# Patient Record
Sex: Female | Born: 1950
Health system: Southern US, Community
[De-identification: ages and names within clinical notes are randomized; demographics above are authoritative.]

## PROBLEM LIST (undated history)

## (undated) DIAGNOSIS — J449 Chronic obstructive pulmonary disease, unspecified: Secondary | ICD-10-CM

## (undated) DIAGNOSIS — M810 Age-related osteoporosis without current pathological fracture: Secondary | ICD-10-CM

## (undated) DIAGNOSIS — N200 Calculus of kidney: Secondary | ICD-10-CM

## (undated) HISTORY — PX: OTHER SURGICAL HISTORY: SHX169

---

## 2000-01-30 ENCOUNTER — Emergency Department (HOSPITAL_COMMUNITY): Admission: EM | Admit: 2000-01-30 | Discharge: 2000-01-30 | Payer: Self-pay | Admitting: Emergency Medicine

## 2000-01-30 ENCOUNTER — Encounter: Payer: Self-pay | Admitting: Emergency Medicine

## 2001-08-04 ENCOUNTER — Encounter: Payer: Self-pay | Admitting: Family Medicine

## 2001-08-04 ENCOUNTER — Ambulatory Visit (HOSPITAL_COMMUNITY): Admission: RE | Admit: 2001-08-04 | Discharge: 2001-08-04 | Payer: Self-pay | Admitting: Family Medicine

## 2004-08-22 ENCOUNTER — Ambulatory Visit (HOSPITAL_COMMUNITY): Admission: RE | Admit: 2004-08-22 | Discharge: 2004-08-22 | Payer: Self-pay | Admitting: Family Medicine

## 2006-11-09 ENCOUNTER — Ambulatory Visit: Payer: Self-pay | Admitting: Orthopedic Surgery

## 2006-11-17 ENCOUNTER — Encounter (HOSPITAL_COMMUNITY): Admission: RE | Admit: 2006-11-17 | Discharge: 2006-12-17 | Payer: Self-pay | Admitting: Orthopedic Surgery

## 2006-12-28 ENCOUNTER — Ambulatory Visit: Payer: Self-pay | Admitting: Orthopedic Surgery

## 2007-03-01 ENCOUNTER — Ambulatory Visit: Payer: Self-pay | Admitting: Orthopedic Surgery

## 2010-02-19 ENCOUNTER — Emergency Department (HOSPITAL_COMMUNITY): Admission: EM | Admit: 2010-02-19 | Discharge: 2010-02-19 | Payer: Self-pay | Admitting: Emergency Medicine

## 2012-01-19 ENCOUNTER — Encounter (HOSPITAL_COMMUNITY): Payer: Self-pay | Admitting: *Deleted

## 2012-01-19 ENCOUNTER — Emergency Department (HOSPITAL_COMMUNITY): Payer: Self-pay

## 2012-01-19 ENCOUNTER — Emergency Department (HOSPITAL_COMMUNITY)
Admission: EM | Admit: 2012-01-19 | Discharge: 2012-01-20 | Disposition: A | Discharge status: Home / Self Care | Payer: Self-pay | Attending: Emergency Medicine | Admitting: Emergency Medicine

## 2012-01-19 DIAGNOSIS — R197 Diarrhea, unspecified: Secondary | ICD-10-CM | POA: Insufficient documentation

## 2012-01-19 DIAGNOSIS — F17200 Nicotine dependence, unspecified, uncomplicated: Secondary | ICD-10-CM | POA: Insufficient documentation

## 2012-01-19 DIAGNOSIS — R5383 Other fatigue: Secondary | ICD-10-CM | POA: Insufficient documentation

## 2012-01-19 DIAGNOSIS — R062 Wheezing: Secondary | ICD-10-CM | POA: Insufficient documentation

## 2012-01-19 DIAGNOSIS — R5381 Other malaise: Secondary | ICD-10-CM | POA: Insufficient documentation

## 2012-01-19 DIAGNOSIS — K5900 Constipation, unspecified: Secondary | ICD-10-CM | POA: Insufficient documentation

## 2012-01-19 DIAGNOSIS — R1011 Right upper quadrant pain: Secondary | ICD-10-CM | POA: Insufficient documentation

## 2012-01-19 DIAGNOSIS — R109 Unspecified abdominal pain: Secondary | ICD-10-CM | POA: Insufficient documentation

## 2012-01-19 LAB — CBC
HCT: 46.4 % — ABNORMAL HIGH (ref 36.0–46.0)
MCH: 31.8 pg (ref 26.0–34.0)
MCHC: 34.7 g/dL (ref 30.0–36.0)
MCV: 91.5 fL (ref 78.0–100.0)
RDW: 12.6 % (ref 11.5–15.5)
WBC: 8.4 10*3/uL (ref 4.0–10.5)

## 2012-01-19 LAB — DIFFERENTIAL
Basophils Absolute: 0.1 10*3/uL (ref 0.0–0.1)
Eosinophils Absolute: 0.2 10*3/uL (ref 0.0–0.7)
Eosinophils Relative: 2 % (ref 0–5)
Lymphocytes Relative: 22 % (ref 12–46)
Monocytes Absolute: 0.6 10*3/uL (ref 0.1–1.0)

## 2012-01-19 LAB — URINALYSIS, ROUTINE W REFLEX MICROSCOPIC
Hgb urine dipstick: NEGATIVE
Nitrite: NEGATIVE
Specific Gravity, Urine: 1.015 (ref 1.005–1.030)
Urobilinogen, UA: 0.2 mg/dL (ref 0.0–1.0)

## 2012-01-19 LAB — COMPREHENSIVE METABOLIC PANEL
AST: 12 U/L (ref 0–37)
CO2: 29 mEq/L (ref 19–32)
Calcium: 9.7 mg/dL (ref 8.4–10.5)
Creatinine, Ser: 0.9 mg/dL (ref 0.50–1.10)
GFR calc Af Amer: 78 mL/min — ABNORMAL LOW (ref >90)
GFR calc non Af Amer: 67 mL/min — ABNORMAL LOW (ref >90)

## 2012-01-19 MED ORDER — SODIUM CHLORIDE 0.9 % IN NEBU
INHALATION_SOLUTION | RESPIRATORY_TRACT | Status: AC
  Administered 2012-01-19: 3 mL
  Filled 2012-01-19: qty 3

## 2012-01-19 MED ORDER — HYDROCODONE-ACETAMINOPHEN 5-325 MG PO TABS
2.0000 | ORAL_TABLET | Freq: Once | ORAL | Status: AC
  Administered 2012-01-19: 2 via ORAL
  Filled 2012-01-19: qty 2

## 2012-01-19 MED ORDER — ALBUTEROL SULFATE (5 MG/ML) 0.5% IN NEBU
2.5000 mg | INHALATION_SOLUTION | Freq: Once | RESPIRATORY_TRACT | Status: AC
  Administered 2012-01-19: 2.5 mg via RESPIRATORY_TRACT
  Filled 2012-01-19: qty 0.5

## 2012-01-19 NOTE — ED Notes (Signed)
Assumed c/o pt

## 2012-01-19 NOTE — ED Provider Notes (Signed)
History  This chart was scribed for [employer]. Colon Branch, MD by Toya Smothers. The patient was seen in room APA10/APA10. Patient's care was started at 2039.  CSN: [id number]  Arrival date & time 01/19/12  2039   First MD Initiated Contact with Patient 01/19/12 2051     Chief Complaint  Patient presents with  . Abdominal Pain    The history is provided by the patient. No language interpreter was used.    Brittany Bass is a 61 y.o. female with a h/o jaundice who presents to the Emergency Department complaining of gradual onset, gradually worsening, constant, severe right flank pain described as stabbing that radiates to the back onset 2 days ago with associated symptoms of diarrhea and lethargy. Pt denies injury or strenuous activity as the cause of the pain. She denies any modifying factors. She denies taking any medication to the relieve pain. She states that she had similar episodes of symptoms with jaundice that she developed during childbirth 21 years ago.  Pt denies fever, cough, chills, dysuria, and polyuria. The pt is a current everyday smoker but denies the use of alcohol.  History reviewed. No pertinent past medical history.  Past Surgical History  Procedure Date  . Cesarean section   . Jaundice    History reviewed. No pertinent family history.  History  Substance Use Topics  . Smoking status: Current Everyday Smoker  . Smokeless tobacco: Not on file  . Alcohol Use: No    Review of Systems  A complete 10 system review of systems was obtained and all systems are negative except as noted in the HPI and PMH.   Allergies  Shellfish allergy and Penicillins  Home Medications  No current outpatient prescriptions on file.  Triage Vitals: BP 169/76  Pulse 80  Temp(Src) 97.4 F (36.3 C) (Oral)  Resp 16  Ht 5\' 2"  (1.575 m)  Wt 194 lb 4.8 oz (88.134 kg)  BMI 35.54 kg/m2  SpO2 100%  Physical Exam  Nursing note and vitals reviewed. Constitutional: She is oriented to person,  place, and time. She appears well-developed and well-nourished.  HENT:  Head: Normocephalic and atraumatic.  Eyes: Conjunctivae and EOM are normal.  Neck: Normal range of motion. Neck supple.  Cardiovascular: Normal rate, regular rhythm, normal heart sounds and intact distal pulses.  Exam reveals no gallop and no friction rub.   No murmur heard. Pulmonary/Chest: Effort normal. No respiratory distress. She has wheezes (Expiratory wheezing). She has no rales. She exhibits no tenderness.  Abdominal: Soft. Bowel sounds are normal. She exhibits no distension. There is no tenderness. There is no rebound and no guarding.  Musculoskeletal: Normal range of motion. She exhibits tenderness. She exhibits no edema.       Focal tenderness at the costal margin mid axillary line; pain is reproducible upon palpation  Neurological: She is alert and oriented to person, place, and time. No cranial nerve deficit.  Skin: Skin is warm and dry.  Psychiatric: She has a normal mood and affect. Her behavior is normal.   ED Course  Procedures (including critical care time)  DIAGNOSTIC STUDIES: Oxygen Saturation is 100% on room air, normal by my interpretation.    COORDINATION OF CARE: 9:50PM-Discussed treatment plan which includes urinalysis, breathing treatment, x-ray of abdomen and pain medications with pt and pt agreed to plan.  Results for orders placed during the hospital encounter of 01/19/12  CBC      Component Value Range   WBC 8.4  4.0 -  10.5 (K/uL)   RBC 5.07  3.87 - 5.11 (MIL/uL)   Hemoglobin 16.1 (*) 12.0 - 15.0 (g/dL)   HCT 28.4 (*) 13.2 - 46.0 (%)   MCV 91.5  78.0 - 100.0 (fL)   MCH 31.8  26.0 - 34.0 (pg)   MCHC 34.7  30.0 - 36.0 (g/dL)   RDW 44.0  10.2 - 72.5 (%)   Platelets 186  150 - 400 (K/uL)  DIFFERENTIAL      Component Value Range   Neutrophils Relative 68  43 - 77 (%)   Neutro Abs 5.8  1.7 - 7.7 (K/uL)   Lymphocytes Relative 22  12 - 46 (%)   Lymphs Abs 1.8  0.7 - 4.0 (K/uL)    Monocytes Relative 7  3 - 12 (%)   Monocytes Absolute 0.6  0.1 - 1.0 (K/uL)   Eosinophils Relative 2  0 - 5 (%)   Eosinophils Absolute 0.2  0.0 - 0.7 (K/uL)   Basophils Relative 1  0 - 1 (%)   Basophils Absolute 0.1  0.0 - 0.1 (K/uL)  COMPREHENSIVE METABOLIC PANEL      Component Value Range   Sodium 137  135 - 145 (mEq/L)   Potassium 3.8  3.5 - 5.1 (mEq/L)   Chloride 101  96 - 112 (mEq/L)   CO2 29  19 - 32 (mEq/L)   Glucose, Bld 107 (*) 70 - 99 (mg/dL)   BUN 11  6 - 23 (mg/dL)   Creatinine, Ser 3.66  0.50 - 1.10 (mg/dL)   Calcium 9.7  8.4 - 44.0 (mg/dL)   Total Protein 6.9  6.0 - 8.3 (g/dL)   Albumin 3.7  3.5 - 5.2 (g/dL)   AST 12  0 - 37 (U/L)   ALT 13  0 - 35 (U/L)   Alkaline Phosphatase 90  39 - 117 (U/L)   Total Bilirubin 0.3  0.3 - 1.2 (mg/dL)   GFR calc non Af Amer 67 (*) >90 (mL/min)   GFR calc Af Amer 78 (*) >90 (mL/min)  URINALYSIS, ROUTINE W REFLEX MICROSCOPIC      Component Value Range   Color, Urine YELLOW  YELLOW    APPearance CLEAR  CLEAR    Specific Gravity, Urine 1.015  1.005 - 1.030    pH 5.5  5.0 - 8.0    Glucose, UA NEGATIVE  NEGATIVE (mg/dL)   Hgb urine dipstick NEGATIVE  NEGATIVE    Bilirubin Urine NEGATIVE  NEGATIVE    Ketones, ur NEGATIVE  NEGATIVE (mg/dL)   Protein, ur NEGATIVE  NEGATIVE (mg/dL)   Urobilinogen, UA 0.2  0.0 - 1.0 (mg/dL)   Nitrite NEGATIVE  NEGATIVE    Leukocytes, UA NEGATIVE  NEGATIVE    Dg Abd Acute W/chest  01/19/2012  *RADIOLOGY REPORT*  Clinical Data: Right upper quadrant abdominal pain  ACUTE ABDOMEN SERIES (ABDOMEN 2 VIEW & CHEST 1 VIEW)  Comparison: None.  Findings:  Normal cardiac silhouette and mediastinal contours with minimal atherosclerotic calcifications within the aortic arch.  Minimal bibasilar opacity favored to represent atelectasis.  No focal airspace opacities.  No pleural effusion or pneumothorax.  There is mild gaseous distension of the colon without definite evidence of enteric obstruction.  No pneumoperitoneum,  pneumatosis or portal venous gas.  Mild S-shaped scoliotic curvature of the thoracolumbar spine, possibly positional.  No acute osseous abnormalities.  IMPRESSION: 1.  No acute cardiopulmonary disease. 2.  Mild gaseous distension of the colon, nonspecific but may suggest ileus.  Original Report Authenticated By: Jonny Ruiz  A. Judithann Sheen, M.D.      MDM  Patient with RUQ abdominal pain and mild constipation. Laboratory data was unremarkable. Acute abdomen series without acute findings. Incidental during the physical exam the patient has some expiratory wheezing. She has received hydrocodone, albuterol, and IV fluids. Pain had improved.Pt stable in ED with no significant deterioration in condition.The patient appears reasonably screened and/or stabilized for discharge and I doubt any other medical condition or other [facility] requiring further screening, evaluation, or treatment in the ED at this time prior to discharge.  I personally performed the services described in this documentation, which was scribed in my presence. The recorded information has been reviewed and considered.   MDM Reviewed: nursing note and vitals Interpretation: labs and x-ray       Nicoletta Dress. Colon Branch, MD 01/21/12 1018

## 2012-01-19 NOTE — ED Notes (Signed)
RUQ pain, and around to back.

## 2012-01-20 MED ORDER — HYDROCODONE-ACETAMINOPHEN 5-325 MG PO TABS: 1.0000 | ORAL_TABLET | ORAL | Status: AC | PRN

## 2012-01-20 NOTE — ED Notes (Signed)
Left in c/o family for transport home; in no distress; instructions/prescriptions reviewed-verbalizes understanding.

## 2012-01-20 NOTE — Discharge Instructions (Signed)
Her blood work was normal here tonight. That film of your abdomen shows only gas. There is no evidence of an obstruction or dangerous condition. Use mild laxative like MiraLax. He may use the pain medicine as needed however be careful because pain medicine can also constipate you.Follow up with your doctor.

## 2012-01-25 ENCOUNTER — Encounter (HOSPITAL_COMMUNITY): Payer: Self-pay | Admitting: *Deleted

## 2012-01-25 ENCOUNTER — Emergency Department (HOSPITAL_COMMUNITY)
Admission: EM | Admit: 2012-01-25 | Discharge: 2012-01-25 | Disposition: A | Discharge status: Home / Self Care | Payer: Self-pay | Attending: Emergency Medicine | Admitting: Emergency Medicine

## 2012-01-25 DIAGNOSIS — B029 Zoster without complications: Secondary | ICD-10-CM | POA: Insufficient documentation

## 2012-01-25 DIAGNOSIS — F17200 Nicotine dependence, unspecified, uncomplicated: Secondary | ICD-10-CM | POA: Insufficient documentation

## 2012-01-25 MED ORDER — TRAMADOL HCL 50 MG PO TABS: 100.0000 mg | ORAL_TABLET | Freq: Four times a day (QID) | ORAL | Status: AC | PRN

## 2012-01-25 MED ORDER — PREDNISONE 20 MG PO TABS: 60.0000 mg | ORAL_TABLET | Freq: Every day | ORAL | Status: AC

## 2012-01-25 MED ORDER — ACYCLOVIR 400 MG PO TABS: 800.0000 mg | ORAL_TABLET | Freq: Every day | ORAL | Status: AC

## 2012-01-25 NOTE — ED Provider Notes (Signed)
History     CSN: [id number]  Arrival date & time 01/25/12  0547   First MD Initiated Contact with Patient 01/25/12 0710      Chief Complaint  Patient presents with  . Herpes Zoster    (Consider location/radiation/quality/duration/timing/severity/associated sxs/prior treatment) HPI....rash on right side of her chest for several days.  Palpation makes it worse. Nothing makes it better. No fever or chills. Pain is sharp. No radiation. Patient thinks is shingles History reviewed. No pertinent past medical history.  Past Surgical History  Procedure Date  . Cesarean section   . Jaundice     History reviewed. No pertinent family history.  History  Substance Use Topics  . Smoking status: Current Everyday Smoker  . Smokeless tobacco: Not on file  . Alcohol Use: No    OB History    Grav Para Term Preterm Abortions TAB SAB Ect Mult Living                  Review of Systems  All other systems reviewed and are negative.    Allergies  Shellfish allergy and Penicillins  Home Medications   Current Outpatient Rx  Name Route Sig Dispense Refill  . ACYCLOVIR 400 MG PO TABS Oral Take 2 tablets (800 mg total) by mouth 5 (five) times daily. 50 tablet 0  . HYDROCODONE-ACETAMINOPHEN 5-325 MG PO TABS Oral Take 1 tablet by mouth every 4 (four) hours as needed for pain. 15 tablet 0  . PREDNISONE 20 MG PO TABS Oral Take 3 tablets (60 mg total) by mouth daily. 21 tablet 0  . TRAMADOL HCL 50 MG PO TABS Oral Take 2 tablets (100 mg total) by mouth every 6 (six) hours as needed for pain. 30 tablet 0    BP 121/60  Pulse 87  Temp(Src) 97.8 F (36.6 C) (Oral)  Resp 16  Ht 5\' 2"  (1.575 m)  Wt 197 lb (89.359 kg)  BMI 36.03 kg/m2  SpO2 99%  Physical Exam  Nursing note and vitals reviewed. Constitutional: She is oriented to person, place, and time. She appears well-developed and well-nourished.  HENT:  Head: Normocephalic.  Musculoskeletal: Normal range of motion.  Neurological: She  is alert and oriented to person, place, and time.  Skin:       Area of erythema right lateral chest wall approximately T10 dermatome. Crusted healing vesicles.  Psychiatric: She has a normal mood and affect.    ED Course  Procedures (including critical care time)  Labs Reviewed - No data to display No results found.   1. Shingles       MDM  History and physical consistent with shingles. Will Rx tramadol, prednisone, acyclovir. Discussed with patient and friend        Donnetta Hutching, MD 01/25/12 [phone]

## 2012-01-25 NOTE — Discharge Instructions (Signed)
Shingles Shingles is caused by the same virus that causes chickenpox (varicella zoster virus or VZV). Shingles often occurs many years or decades after having chickenpox. That is why it is more common in adults older than 50 years. The virus reactivates and breaks out as an infection in a nerve root. SYMPTOMS   The initial feeling (sensations) may be pain. This pain is usually described as:   Burning.   Stabbing.   Throbbing.   Tingling in the nerve root.   A red rash will follow in a couple days. The rash may occur in any area of the body and is usually on one side (unilateral) of the body in a band or belt-like pattern. The rash usually starts out as very small blisters (vesicles). They will dry up after 7 to 10 days. This is not usually a significant problem except for the pain it causes.   Long-lasting (chronic) pain is more likely in an elderly person. It can last months to years. This condition is called postherpetic neuralgia.  Shingles can be an extremely severe infection in someone with AIDS, a weakened immune system, or with forms of leukemia. It can also be severe if you are taking transplant medicines or other medicines that weaken the immune system. TREATMENT  Your caregiver will often treat you with:  Antiviral drugs.   Anti-inflammatory drugs.   Pain medicines.  Bed rest is very important in preventing the pain associated with herpes zoster (postherpetic neuralgia). Application of heat in the form of a hot water bottle or electric heating pad or gentle pressure with the hand is recommended to help with the pain or discomfort. PREVENTION  A varicella zoster vaccine is available to help protect against the virus. The Food and Drug Administration approved the varicella zoster vaccine for individuals 48 years of age and older. HOME CARE INSTRUCTIONS   Cool compresses to the area of rash may be helpful.   Only take over-the-counter or prescription medicines for pain,  discomfort, or fever as directed by your caregiver.   Avoid contact with:   Babies.   Pregnant women.   Children with eczema.   Elderly people with transplants.   People with chronic illnesses, such as leukemia and AIDS.   If the area involved is on your face, you may receive a referral for follow-up to a specialist. It is very important to keep all follow-up appointments. This will help avoid eye complications, chronic pain, or disability.  SEEK IMMEDIATE MEDICAL CARE IF:   You develop any pain (headache) in the area of the face or eye. This must be followed carefully by your caregiver or ophthalmologist. An infection in part of your eye (cornea) can be very serious. It could lead to blindness.   You do not have pain relief from prescribed medicines.   Your redness or swelling spreads.   The area involved becomes very swollen and painful.   You have a fever.   You notice any red or painful lines extending away from the affected area toward your heart (lymphangitis).   Your condition is worsening or has changed.  Document Released: 09/08/2005 Document Revised: 08/28/2011 Document Reviewed: 08/13/2009 ExitCare Patient Information 2012 [place]  Medication for pain, inflammation, antiviral.  Off work Sunday secondary to illness

## 2012-01-25 NOTE — ED Notes (Signed)
Broke out in rash on right side for  2 days. Thinks it shingles

## 2015-11-28 ENCOUNTER — Emergency Department (HOSPITAL_COMMUNITY)
Admission: EM | Admit: 2015-11-28 | Discharge: 2015-11-28 | Disposition: A | Discharge status: Home / Self Care | Payer: PPO | Attending: Emergency Medicine | Admitting: Emergency Medicine

## 2015-11-28 ENCOUNTER — Encounter (HOSPITAL_COMMUNITY): Payer: Self-pay | Admitting: Emergency Medicine

## 2015-11-28 DIAGNOSIS — X58XXXA Exposure to other specified factors, initial encounter: Secondary | ICD-10-CM | POA: Diagnosis not present

## 2015-11-28 DIAGNOSIS — IMO0001 Reserved for inherently not codable concepts without codable children: Secondary | ICD-10-CM

## 2015-11-28 DIAGNOSIS — Y939 Activity, unspecified: Secondary | ICD-10-CM | POA: Diagnosis not present

## 2015-11-28 DIAGNOSIS — L03012 Cellulitis of left finger: Secondary | ICD-10-CM | POA: Insufficient documentation

## 2015-11-28 DIAGNOSIS — F17210 Nicotine dependence, cigarettes, uncomplicated: Secondary | ICD-10-CM | POA: Diagnosis not present

## 2015-11-28 DIAGNOSIS — S6992XA Unspecified injury of left wrist, hand and finger(s), initial encounter: Secondary | ICD-10-CM | POA: Diagnosis not present

## 2015-11-28 DIAGNOSIS — Y929 Unspecified place or not applicable: Secondary | ICD-10-CM | POA: Insufficient documentation

## 2015-11-28 DIAGNOSIS — Y999 Unspecified external cause status: Secondary | ICD-10-CM | POA: Insufficient documentation

## 2015-11-28 MED ORDER — LIDOCAINE HCL (PF) 1 % IJ SOLN
INTRAMUSCULAR | Status: DC
Start: 2015-11-28 — End: 2015-11-28
  Filled 2015-11-28: qty 5

## 2015-11-28 MED ORDER — SULFAMETHOXAZOLE-TRIMETHOPRIM 800-160 MG PO TABS: 1.0000 | ORAL_TABLET | Freq: Two times a day (BID) | ORAL | Status: AC

## 2015-11-28 NOTE — Discharge Instructions (Signed)
Paronychia °Paronychia is an infection of the skin that surrounds a nail. It usually affects the skin around a fingernail, but it may also occur near a toenail. It often causes pain and swelling around the nail. This condition may come on suddenly or develop over a longer period. In some cases, a collection of pus (abscess) can form near or under the nail. Usually, paronychia is not serious and it clears up with treatment. °CAUSES °This condition may be caused by bacteria or fungi. It is commonly caused by either Streptococcus or Staphylococcus bacteria. The bacteria or fungi often cause the infection by getting into the affected area through an opening in the skin, such as a cut or a hangnail. °RISK FACTORS °This condition is more likely to develop in: °· People who get their hands wet often, such as those who work as dishwashers, bartenders, or nurses. °· People who bite their fingernails or suck their thumbs. °· People who trim their nails too short. °· People who have hangnails or injured fingertips. °· People who get manicures. °· People who have diabetes. °SYMPTOMS °Symptoms of this condition include: °· Redness and swelling of the skin near the nail. °· Tenderness around the nail when you touch the area. °· Pus-filled bumps under the cuticle. The cuticle is the skin at the base or sides of the nail. °· Fluid or pus under the nail. °· Throbbing pain in the area. °DIAGNOSIS °This condition is usually diagnosed with a physical exam. In some cases, a sample of pus may be taken from an abscess to be tested in a lab. This can help to determine what type of bacteria or fungi is causing the condition. °TREATMENT °Treatment for this condition depends on the cause and severity of the condition. If the condition is mild, it may clear up on its own in a few days. Your health care provider may recommend soaking the affected area in warm water a few times a day. When treatment is needed, the options may  include: °· Antibiotic medicine, if the condition is caused by a bacterial infection. °· Antifungal medicine, if the condition is caused by a fungal infection. °· Incision and drainage, if an abscess is present. In this procedure, the health care provider will cut open the abscess so the pus can drain out. °HOME CARE INSTRUCTIONS °· Soak the affected area in warm water if directed to do so by your health care provider. You may be told to do this for 20 minutes, 2-3 times a day. Keep the area dry in between soakings. °· Take medicines only as directed by your health care provider. °· If you were prescribed an antibiotic medicine, finish all of it even if you start to feel better. °· Keep the affected area clean. °· Do not try to drain a fluid-filled bump yourself. °· If you will be washing dishes or performing other tasks that require your hands to get wet, wear rubber gloves. You should also wear gloves if your hands might come in contact with irritating substances, such as cleaners or chemicals. °· Follow your health care provider's instructions about: °¨ Wound care. °¨ Bandage (dressing) changes and removal. °SEEK MEDICAL CARE IF: °· Your symptoms get worse or do not improve with treatment. °· You have a fever or chills. °· You have redness spreading from the affected area. °· You have continued or increased fluid, blood, or pus coming from the affected area. °· Your finger or knuckle becomes swollen or is difficult to move. °  °  This information is not intended to replace advice given to you by your health care provider. Make sure you discuss any questions you have with your health care provider. °  °Document Released: 03/04/2001 Document Revised: 01/23/2015 Document Reviewed: 08/16/2014 °Elsevier Interactive Patient Education ©2016 [employer] ° °

## 2015-11-28 NOTE — ED Notes (Signed)
Pt states she pulled a hangnail about 2 weeks ago and it has been infected.

## 2015-11-30 NOTE — ED Provider Notes (Signed)
CSN: [id number]     Arrival date & time 11/28/15  1011 History   First MD Initiated Contact with Patient 11/28/15 1135     Chief Complaint  Patient presents with  . Finger Injury     (Consider location/radiation/quality/duration/timing/severity/associated sxs/prior Treatment) The history is provided by the patient.   Brittany Bass is a 65 y.o. female with no significant past medical history presenting with pain, redness and persistent swelling of her left ring finger tip since pulling a hangnail about 2 weeks ago.  She endorses using warm epsom salt soaks and has been able to express a small amount of clear to yellow drainage from the cuticle edge, but continues to have pain, redness and swelling. There is no radiation of pain which is tender to palpation but tolerable at rest. She has taken no medicines for this problem.      History reviewed. No pertinent past medical history. Past Surgical History  Procedure Laterality Date  . Cesarean section    . Jaundice     History reviewed. No pertinent family history. Social History  Substance Use Topics  . Smoking status: Current Every Day Smoker -- 1.00 packs/day    Types: Cigarettes  . Smokeless tobacco: None  . Alcohol Use: No   OB History    No data available     Review of Systems  Constitutional: Negative for fever and chills.  Musculoskeletal: Positive for arthralgias. Negative for myalgias.  Skin: Positive for color change.  Neurological: Negative for weakness and numbness.      Allergies  Shellfish allergy and Penicillins  Home Medications   Prior to Admission medications   Medication Sig Start Date End Date Taking? Authorizing Provider  sulfamethoxazole-trimethoprim (BACTRIM DS,SEPTRA DS) 800-160 MG tablet Take 1 tablet by mouth 2 (two) times daily. 11/28/15 12/05/15  Evalee Jefferson, PA-C   BP 120/47 mmHg  Pulse 66  Temp(Src) 98 F (36.7 C) (Oral)  Resp 14  Ht 5\' 2"  (1.575 m)  Wt 108.863 kg  BMI 43.89 kg/m2   SpO2 98% Physical Exam  Constitutional: She appears well-developed and well-nourished. No distress.  HENT:  Head: Normocephalic.  Neck: Neck supple.  Cardiovascular: Normal rate.   Pulmonary/Chest: Effort normal.  Musculoskeletal: Normal range of motion. She exhibits tenderness. She exhibits no edema.  Skin: There is erythema.  Erythema and edema of left 4th dorsal phalanx surrounding the nail plate.  No fluctuance or visible yellow pocket suggesting additional pus pocket. Volar fingertip is soft. No red streaking.     ED Course  Procedures (including critical care time) Labs Review Labs Reviewed - No data to display  Imaging Review No results found. I have personally reviewed and evaluated these images and lab results as part of my medical decision-making.   EKG Interpretation None      MDM   Final diagnoses:  Paronychia of fourth finger of left hand    Offered I and D, although I suspect the paronychia for the most part has already drained, pt deferred.  Desirous of abx.  She was placed on bactrim, advised to continue warm soaks as she is doing. Advised close f/u for any worsening sx.  Pt understands and agrees with plan.    Evalee Jefferson, PA-C 11/30/15 [place], DO 12/03/15 1635

## 2016-12-22 ENCOUNTER — Emergency Department (HOSPITAL_COMMUNITY)
Admission: EM | Admit: 2016-12-22 | Discharge: 2016-12-22 | Disposition: A | Discharge status: Home / Self Care | Payer: PPO | Attending: Emergency Medicine | Admitting: Emergency Medicine

## 2016-12-22 ENCOUNTER — Encounter (HOSPITAL_COMMUNITY): Payer: Self-pay | Admitting: Emergency Medicine

## 2016-12-22 ENCOUNTER — Emergency Department (HOSPITAL_COMMUNITY): Payer: PPO

## 2016-12-22 DIAGNOSIS — R05 Cough: Secondary | ICD-10-CM | POA: Diagnosis not present

## 2016-12-22 DIAGNOSIS — R0602 Shortness of breath: Secondary | ICD-10-CM | POA: Diagnosis not present

## 2016-12-22 DIAGNOSIS — J019 Acute sinusitis, unspecified: Secondary | ICD-10-CM | POA: Insufficient documentation

## 2016-12-22 DIAGNOSIS — Z720 Tobacco use: Secondary | ICD-10-CM

## 2016-12-22 DIAGNOSIS — J189 Pneumonia, unspecified organism: Secondary | ICD-10-CM | POA: Diagnosis not present

## 2016-12-22 DIAGNOSIS — F17210 Nicotine dependence, cigarettes, uncomplicated: Secondary | ICD-10-CM | POA: Insufficient documentation

## 2016-12-22 MED ORDER — CLARITHROMYCIN 500 MG PO TABS: 500.0000 mg | ORAL_TABLET | Freq: Two times a day (BID) | ORAL | 0 refills | Status: DC

## 2016-12-22 NOTE — Discharge Instructions (Signed)
Use a nasal decongestant like Afrin for 3-4 days. If you need additional nasal decongestant, use Flonase nasal spray.  For cough, use Robitussin DM.  Use Tylenol for fever or pain.  Follow up with a primary care doctor for a check up in 3-4 weeks. You need to have a repeat Chest X-ray then.   Return here if needed for problems.

## 2016-12-22 NOTE — ED Provider Notes (Signed)
[facility] DEPT Provider Note   CSN: [id number] Arrival date & time: 12/22/16  1016     History   Chief Complaint Chief Complaint  Patient presents with  . Cough    HPI Brittany Bass is a 66 y.o. female.  She presents for evaluation of nasal congestion, followed by cough, for 1 week.  Nasal congestion is green in color as is the sputum with coughing.  He has mild dyspnea on exertion.  She is a smoker and states that she has COPD, but does not take anything for it.  He denies nausea or vomiting but has not had much appetite in the last 2 days.  She denies chest pain, weakness, paresthesia, back pain.  No recent similar problems.  No chronic sinus problems.  There are no other known modifying factors.    HPI  History reviewed. No pertinent past medical history.  There are no active problems to display for this patient.   Past Surgical History:  Procedure Laterality Date  . CESAREAN SECTION    . jaundice      OB History    No data available       Home Medications    Prior to Admission medications   Medication Sig Start Date End Date Taking? Authorizing Provider  clarithromycin (BIAXIN) 500 MG tablet Take 1 tablet (500 mg total) by mouth 2 (two) times daily. 12/22/16   Daleen Bo, MD    Family History History reviewed. No pertinent family history.  Social History Social History  Substance Use Topics  . Smoking status: Current Every Day Smoker    Packs/day: 1.00    Types: Cigarettes  . Smokeless tobacco: Never Used  . Alcohol use No     Allergies   Shellfish allergy and Penicillins   Review of Systems Review of Systems  All other systems reviewed and are negative.    Physical Exam Updated Vital Signs BP 130/70   Pulse 87   Temp 97.8 F (36.6 C) (Oral)   Resp 18   Ht 5\' 2"  (1.575 m)   Wt 210 lb (95.3 kg)   SpO2 97%   BMI 38.41 kg/m   Physical Exam  Constitutional: She is oriented to person, place, and time. She appears  well-developed and well-nourished. No distress.  HENT:  Head: Normocephalic and atraumatic.  Eyes: Conjunctivae and EOM are normal. Pupils are equal, round, and reactive to light.  Neck: Normal range of motion and phonation normal. Neck supple.  Cardiovascular: Normal rate and regular rhythm.   Pulmonary/Chest: Effort normal. She exhibits no tenderness.  Scattered rhonchi, no wheezes.  Good air movement bilaterally.  Abdominal: Soft. She exhibits no distension. There is no tenderness. There is no guarding.  Musculoskeletal: Normal range of motion. She exhibits edema (1+ bilateral lower legs.).  Neurological: She is alert and oriented to person, place, and time. She exhibits normal muscle tone.  Skin: Skin is warm and dry.  Psychiatric: She has a normal mood and affect. Her behavior is normal. Judgment and thought content normal.  Nursing note and vitals reviewed.    ED Treatments / Results  Labs (all labs ordered are listed, but only abnormal results are displayed) Labs Reviewed - No data to display  EKG  EKG Interpretation None       Radiology Dg Chest 2 View  Result Date: 12/22/2016 CLINICAL DATA:  Two days of fever. One week of shortness of breath and cough. Current smoker. EXAM: CHEST  2 VIEW COMPARISON:  None  in [facility] FINDINGS: The lungs are well-expanded. There are coarse lung markings in the right infrahilar region posteriorly. There is no pleural effusion or pneumothorax. The heart and pulmonary vascularity are normal. There is calcification in the wall of the aortic arch. The trachea is midline. The bony thorax is unremarkable. IMPRESSION: Patchy alveolar opacities in the right lower lobe consistent with pneumonia. Followup PA and lateral chest X-ray is recommended in 3-4 weeks following trial of antibiotic therapy to ensure resolution and exclude underlying malignancy. Thoracic aortic atherosclerosis. Electronically Signed   By: David  Martinique M.D.   On: 12/22/2016 11:37     Procedures Procedures (including critical care time)  Medications Ordered in ED Medications - No data to display   Initial Impression / Assessment and Plan / ED Course  I have reviewed the triage vital signs and the nursing notes.  Pertinent labs & imaging results that were available during my care of the patient were reviewed by me and considered in my medical decision making (see chart for details).     Medications - No data to display  Patient Vitals for the past 24 hrs:  BP Temp Temp src Pulse Resp SpO2 Height Weight  12/22/16 1439 130/70 97.8 F (36.6 C) Oral 87 18 97 % - -  12/22/16 1108 - - - - - - 5\' 2"  (1.575 m) 210 lb (95.3 kg)  12/22/16 1107 118/60 97.8 F (36.6 C) Oral 89 (!) 24 97 % - -    3:29 PM Reevaluation with update and discussion. After initial assessment and treatment, an updated evaluation reveals she remains comfortable has no further complaints.  Findings discussed with patient and son, all questions answered. Toba Claudio L    Final Clinical Impressions(s) / ED Diagnoses   Final diagnoses:  Acute sinusitis, recurrence not specified, unspecified location  Community acquired pneumonia of right lung, unspecified part of lung  Tobacco abuse    Patient presents for evaluation of nasal and chest congestion, with signs and symptoms of infection.  Suspect bacterial component.  COPD, without significant respiratory compromise.  Nursing Notes Reviewed/ Care Coordinated Applicable Imaging Reviewed Interpretation of Laboratory Data incorporated into ED treatment  The patient appears reasonably screened and/or stabilized for discharge and I doubt any other medical condition or other [facility] requiring further screening, evaluation, or treatment in the ED at this time prior to discharge.  Plan: Home Medications-APAP for fever, Robitussin DM for cough, Afrin or Flonase for nasal congestion; Home Treatments-rest, fluids, stop smoking; return here if the  recommended treatment, does not improve the symptoms; Recommended follow up-PCP of choice in 3-4 weeks, and as needed.  Suggest repeat chest x-ray in 1 month.   New Prescriptions New Prescriptions   CLARITHROMYCIN (BIAXIN) 500 MG TABLET    Take 1 tablet (500 mg total) by mouth 2 (two) times daily.     Daleen Bo, MD 12/22/16 [phone]

## 2016-12-22 NOTE — ED Triage Notes (Signed)
Pt c/o congestion and prod cough x 1 week that is green. c/o some sob. c/o fevers. nad at this time

## 2018-12-08 ENCOUNTER — Encounter (HOSPITAL_COMMUNITY): Payer: Self-pay | Admitting: Emergency Medicine

## 2018-12-08 ENCOUNTER — Emergency Department (HOSPITAL_COMMUNITY)
Admission: EM | Admit: 2018-12-08 | Discharge: 2018-12-08 | Disposition: A | Discharge status: Home / Self Care | Payer: PPO | Attending: Emergency Medicine | Admitting: Emergency Medicine

## 2018-12-08 ENCOUNTER — Other Ambulatory Visit: Payer: Self-pay

## 2018-12-08 DIAGNOSIS — F17210 Nicotine dependence, cigarettes, uncomplicated: Secondary | ICD-10-CM | POA: Diagnosis not present

## 2018-12-08 DIAGNOSIS — R21 Rash and other nonspecific skin eruption: Secondary | ICD-10-CM | POA: Diagnosis not present

## 2018-12-08 MED ORDER — PREDNISONE 10 MG PO TABS: 40.0000 mg | ORAL_TABLET | Freq: Every day | ORAL | 0 refills | Status: DC

## 2018-12-08 MED ORDER — PREDNISONE 50 MG PO TABS
60.0000 mg | ORAL_TABLET | Freq: Once | ORAL | Status: AC
  Administered 2018-12-08: 60 mg via ORAL
  Filled 2018-12-08: qty 1

## 2018-12-08 MED ORDER — PREDNISONE 50 MG PO TABS
ORAL_TABLET | ORAL | Status: AC
  Filled 2018-12-08: qty 1

## 2018-12-08 NOTE — ED Provider Notes (Signed)
[facility] EMERGENCY DEPARTMENT Provider Note   CSN: [id number] Arrival date & time: 12/08/18  1906    History   Chief Complaint Chief Complaint  Patient presents with  . Rash    HPI Brittany Bass is a 68 y.o. female.     Patient with onset of rash to right upper extremity and left anterior leg.  States is not on the face or trunk.  Patient took Benadryl at home for it yesterday did not take any today.  Does itch.  Patient is on a fair amount of scratching.  Patient has no tongue swelling lip swelling throat tightening or any wheezing or shortness of breath.  Patient denies any new laundry detergent soaps body wash or environmental changes.  Has not been out in the woods.  Patient is never anything like this before.  Patient also has no upper respiratory symptoms no fever no chills no chest pain no shortness of breath no abdominal pain nausea vomiting or diarrhea.  Patient without any travel outside the state or overseas.  Patient with no known coronavirus contacts.     History reviewed. No pertinent past medical history.  There are no active problems to display for this patient.   Past Surgical History:  Procedure Laterality Date  . CESAREAN SECTION    . jaundice       OB History   No obstetric history on file.      Home Medications    Prior to Admission medications   Medication Sig Start Date End Date Taking? Authorizing Provider  predniSONE (DELTASONE) 10 MG tablet Take 4 tablets (40 mg total) by mouth daily. 12/08/18   Fredia Sorrow, MD    Family History History reviewed. No pertinent family history.  Social History Social History   Tobacco Use  . Smoking status: Current Every Day Smoker    Packs/day: 1.00    Types: Cigarettes  . Smokeless tobacco: Never Used  Substance Use Topics  . Alcohol use: No  . Drug use: No     Allergies   Shellfish allergy and Penicillins   Review of Systems Review of Systems  Constitutional: Negative for chills  and fever.  HENT: Negative for rhinorrhea and sore throat.   Eyes: Negative for visual disturbance.  Respiratory: Negative for cough and shortness of breath.   Cardiovascular: Negative for chest pain and leg swelling.  Gastrointestinal: Negative for abdominal pain, diarrhea, nausea and vomiting.  Genitourinary: Negative for dysuria.  Musculoskeletal: Negative for back pain and neck pain.  Skin: Positive for rash.  Neurological: Negative for dizziness, light-headedness and headaches.  Hematological: Does not bruise/bleed easily.  Psychiatric/Behavioral: Negative for confusion.     Physical Exam Updated Vital Signs BP (!) 157/81 (BP Location: Right Arm)   Pulse 84   Temp 98.1 F (36.7 C) (Oral)   Resp 14   Ht 1.549 m (5\' 1" )   Wt 104.3 kg   SpO2 97%   BMI 43.46 kg/m   Physical Exam Vitals signs and nursing note reviewed.  Constitutional:      General: She is not in acute distress.    Appearance: She is well-developed.  HENT:     Head: Normocephalic and atraumatic.     Nose: No congestion.     Mouth/Throat:     Mouth: Mucous membranes are moist.  Eyes:     Extraocular Movements: Extraocular movements intact.     Conjunctiva/sclera: Conjunctivae normal.     Pupils: Pupils are equal, round, and reactive to  light.  Neck:     Musculoskeletal: Normal range of motion and neck supple. No neck rigidity.  Cardiovascular:     Rate and Rhythm: Normal rate and regular rhythm.     Heart sounds: Normal heart sounds. No murmur.  Pulmonary:     Effort: Pulmonary effort is normal. No respiratory distress.     Breath sounds: Normal breath sounds.  Abdominal:     General: Bowel sounds are normal.     Palpations: Abdomen is soft.     Tenderness: There is no abdominal tenderness.  Musculoskeletal: Normal range of motion.  Skin:    General: Skin is warm and dry.     Capillary Refill: Capillary refill takes less than 2 seconds.     Findings: Rash present.     Comments: Patient has  done some scratching to a erythematous type rash to her left anterior leg and her right forearm.  No evidence of any distinct vesicles.  Does not seem to have a pattern consistent with a contact dermatitis with the skin does appear dry.  Patient is done some scratching so it is making it difficult to define the rash more precisely.  Neurological:     General: No focal deficit present.     Mental Status: She is alert and oriented to person, place, and time.      ED Treatments / Results  Labs (all labs ordered are listed, but only abnormal results are displayed) Labs Reviewed - No data to display  EKG None  Radiology No results found.  Procedures Procedures (including critical care time)  Medications Ordered in ED Medications  predniSONE (DELTASONE) 50 MG tablet (has no administration in time range)  predniSONE (DELTASONE) tablet 60 mg (60 mg Oral Given 12/08/18 2014)     Initial Impression / Assessment and Plan / ED Course  I have reviewed the triage vital signs and the nursing notes.  Pertinent labs & imaging results that were available during my care of the patient were reviewed by me and considered in my medical decision making (see chart for details).         Patient with a rash to both forearms predominantly the right forearm and the left anterior leg.  Is been scratched a lot so is hard to tell anything about vesicles or pustules.  Does not seem to be directly consistent with a contact dermatitis but it is possible.  But the locations are unusual.  Patient without any systemic symptoms.  Patient denies any new chemicals or exposure to the skin.  Or any new products added to the skin.  We will go ahead and treat with a course of prednisone.  First dose provided here tonight.  Prescription provided for another 5 days of treatment.  Patient again has no systemic symptoms nontoxic no acute distress.  Patient will return for any new or worse symptoms.  Certainly does not seem to  be consistent with hives or any type of allergic reaction except for possible contact dermatitis.  Patient without any upper respiratory symptoms.  Final Clinical Impressions(s) / ED Diagnoses   Final diagnoses:  Rash    ED Discharge Orders         Ordered    predniSONE (DELTASONE) 10 MG tablet  Daily     12/08/18 2035           Fredia Sorrow, MD 12/08/18 2051

## 2018-12-08 NOTE — Discharge Instructions (Addendum)
Trial of prednisone for the rash.  Return for any new or worse symptoms.  Take the prednisone as directed for the next 5 days.  Exact cause of the rash is not clear at this time.  But is reassuring that you are not having any systemic symptoms like feeling ill or fever.

## 2018-12-08 NOTE — ED Triage Notes (Signed)
Pt states BLE and BUE rash that started two days ago, taken Benadryl at home yesterday, none today. Denies new laundry detergents, soaps, body wash, or environmental changes.

## 2019-02-28 DIAGNOSIS — H2513 Age-related nuclear cataract, bilateral: Secondary | ICD-10-CM | POA: Diagnosis not present

## 2019-03-12 ENCOUNTER — Other Ambulatory Visit: Payer: Self-pay

## 2019-03-12 ENCOUNTER — Inpatient Hospital Stay (HOSPITAL_COMMUNITY)
Admission: EM | Admit: 2019-03-12 | Discharge: 2019-03-15 | DRG: 291 | Disposition: A | Discharge status: Home / Self Care | Payer: PPO | Attending: Internal Medicine | Admitting: Internal Medicine

## 2019-03-12 ENCOUNTER — Encounter (HOSPITAL_COMMUNITY): Payer: Self-pay | Admitting: Emergency Medicine

## 2019-03-12 ENCOUNTER — Emergency Department (HOSPITAL_COMMUNITY): Payer: PPO

## 2019-03-12 DIAGNOSIS — R0602 Shortness of breath: Secondary | ICD-10-CM | POA: Diagnosis not present

## 2019-03-12 DIAGNOSIS — J9601 Acute respiratory failure with hypoxia: Secondary | ICD-10-CM | POA: Diagnosis present

## 2019-03-12 DIAGNOSIS — I110 Hypertensive heart disease with heart failure: Secondary | ICD-10-CM | POA: Diagnosis not present

## 2019-03-12 DIAGNOSIS — Z800 Family history of malignant neoplasm of digestive organs: Secondary | ICD-10-CM

## 2019-03-12 DIAGNOSIS — I10 Essential (primary) hypertension: Secondary | ICD-10-CM | POA: Diagnosis present

## 2019-03-12 DIAGNOSIS — Z91013 Allergy to seafood: Secondary | ICD-10-CM

## 2019-03-12 DIAGNOSIS — I509 Heart failure, unspecified: Secondary | ICD-10-CM | POA: Diagnosis not present

## 2019-03-12 DIAGNOSIS — Z87892 Personal history of anaphylaxis: Secondary | ICD-10-CM

## 2019-03-12 DIAGNOSIS — I5033 Acute on chronic diastolic (congestive) heart failure: Secondary | ICD-10-CM | POA: Diagnosis present

## 2019-03-12 DIAGNOSIS — Z20828 Contact with and (suspected) exposure to other viral communicable diseases: Secondary | ICD-10-CM | POA: Diagnosis present

## 2019-03-12 DIAGNOSIS — Z79899 Other long term (current) drug therapy: Secondary | ICD-10-CM | POA: Diagnosis not present

## 2019-03-12 DIAGNOSIS — Z803 Family history of malignant neoplasm of breast: Secondary | ICD-10-CM | POA: Diagnosis not present

## 2019-03-12 DIAGNOSIS — Z880 Allergy status to penicillin: Secondary | ICD-10-CM | POA: Diagnosis not present

## 2019-03-12 DIAGNOSIS — E669 Obesity, unspecified: Secondary | ICD-10-CM | POA: Diagnosis not present

## 2019-03-12 DIAGNOSIS — J441 Chronic obstructive pulmonary disease with (acute) exacerbation: Secondary | ICD-10-CM | POA: Diagnosis not present

## 2019-03-12 DIAGNOSIS — F17210 Nicotine dependence, cigarettes, uncomplicated: Secondary | ICD-10-CM | POA: Diagnosis not present

## 2019-03-12 DIAGNOSIS — Z6841 Body Mass Index (BMI) 40.0 and over, adult: Secondary | ICD-10-CM

## 2019-03-12 LAB — CBC WITH DIFFERENTIAL/PLATELET
Abs Immature Granulocytes: 0.04 10*3/uL (ref 0.00–0.07)
Basophils Absolute: 0 10*3/uL (ref 0.0–0.1)
Basophils Relative: 0 %
Eosinophils Absolute: 0.1 10*3/uL (ref 0.0–0.5)
Eosinophils Relative: 1 %
HCT: 49 % — ABNORMAL HIGH (ref 36.0–46.0)
Hemoglobin: 15.1 g/dL — ABNORMAL HIGH (ref 12.0–15.0)
Immature Granulocytes: 0 %
Lymphocytes Relative: 14 %
Lymphs Abs: 1.3 10*3/uL (ref 0.7–4.0)
MCH: 30.7 pg (ref 26.0–34.0)
MCHC: 30.8 g/dL (ref 30.0–36.0)
MCV: 99.6 fL (ref 80.0–100.0)
Monocytes Absolute: 0.8 10*3/uL (ref 0.1–1.0)
Monocytes Relative: 9 %
Neutro Abs: 7 10*3/uL (ref 1.7–7.7)
Neutrophils Relative %: 76 %
Platelets: 217 10*3/uL (ref 150–400)
RBC: 4.92 MIL/uL (ref 3.87–5.11)
RDW: 13.8 % (ref 11.5–15.5)
WBC: 9.2 10*3/uL (ref 4.0–10.5)
nRBC: 0 % (ref 0.0–0.2)

## 2019-03-12 LAB — BASIC METABOLIC PANEL
Anion gap: 8 (ref 5–15)
BUN: 17 mg/dL (ref 8–23)
CO2: 32 mmol/L (ref 22–32)
Calcium: 8.5 mg/dL — ABNORMAL LOW (ref 8.9–10.3)
Chloride: 101 mmol/L (ref 98–111)
Creatinine, Ser: 0.98 mg/dL (ref 0.44–1.00)
GFR calc Af Amer: >60 mL/min (ref >60)
GFR calc non Af Amer: 59 mL/min — ABNORMAL LOW (ref >60)
Glucose, Bld: 89 mg/dL (ref 70–99)
Potassium: 4.2 mmol/L (ref 3.5–5.1)
Sodium: 141 mmol/L (ref 135–145)

## 2019-03-12 LAB — BRAIN NATRIURETIC PEPTIDE: B Natriuretic Peptide: 197 pg/mL — ABNORMAL HIGH (ref 0.0–100.0)

## 2019-03-12 LAB — TROPONIN I: Troponin I: 0.03 ng/mL (ref <0.03)

## 2019-03-12 LAB — SARS CORONAVIRUS 2 BY RT PCR (HOSPITAL ORDER, PERFORMED IN ~~LOC~~ HOSPITAL LAB): SARS Coronavirus 2: NEGATIVE

## 2019-03-12 MED ORDER — ALBUTEROL SULFATE HFA 108 (90 BASE) MCG/ACT IN AERS
4.0000 | INHALATION_SPRAY | Freq: Once | RESPIRATORY_TRACT | Status: AC
  Administered 2019-03-12: 4 via RESPIRATORY_TRACT
  Filled 2019-03-12: qty 6.7

## 2019-03-12 MED ORDER — METHYLPREDNISOLONE SODIUM SUCC 125 MG IJ SOLR
60.0000 mg | Freq: Four times a day (QID) | INTRAMUSCULAR | Status: DC
  Administered 2019-03-13 (×2): 60 mg via INTRAVENOUS
  Filled 2019-03-12 (×2): qty 2

## 2019-03-12 MED ORDER — FUROSEMIDE 10 MG/ML IJ SOLN
40.0000 mg | Freq: Two times a day (BID) | INTRAMUSCULAR | Status: DC
  Administered 2019-03-13 – 2019-03-15 (×5): 40 mg via INTRAVENOUS
  Filled 2019-03-12 (×5): qty 4

## 2019-03-12 MED ORDER — ALBUTEROL SULFATE (2.5 MG/3ML) 0.083% IN NEBU: 2.5000 mg | INHALATION_SOLUTION | RESPIRATORY_TRACT | Status: DC | PRN

## 2019-03-12 MED ORDER — NICOTINE 14 MG/24HR TD PT24
14.0000 mg | MEDICATED_PATCH | Freq: Every day | TRANSDERMAL | Status: DC
  Administered 2019-03-13 – 2019-03-15 (×3): 14 mg via TRANSDERMAL
  Filled 2019-03-12 (×4): qty 1

## 2019-03-12 MED ORDER — SODIUM CHLORIDE 0.9% FLUSH: 3.0000 mL | INTRAVENOUS | Status: DC | PRN

## 2019-03-12 MED ORDER — ENOXAPARIN SODIUM 40 MG/0.4ML ~~LOC~~ SOLN
40.0000 mg | SUBCUTANEOUS | Status: DC
  Administered 2019-03-12 – 2019-03-14 (×3): 40 mg via SUBCUTANEOUS
  Filled 2019-03-12 (×3): qty 0.4

## 2019-03-12 MED ORDER — FUROSEMIDE 10 MG/ML IJ SOLN
40.0000 mg | Freq: Once | INTRAMUSCULAR | Status: AC
  Administered 2019-03-12: 40 mg via INTRAVENOUS
  Filled 2019-03-12: qty 4

## 2019-03-12 MED ORDER — ACETAMINOPHEN 325 MG PO TABS
650.0000 mg | ORAL_TABLET | ORAL | Status: DC | PRN
  Administered 2019-03-14: 650 mg via ORAL
  Filled 2019-03-12: qty 2

## 2019-03-12 MED ORDER — IPRATROPIUM-ALBUTEROL 0.5-2.5 (3) MG/3ML IN SOLN
3.0000 mL | Freq: Three times a day (TID) | RESPIRATORY_TRACT | Status: DC
  Administered 2019-03-13 – 2019-03-15 (×7): 3 mL via RESPIRATORY_TRACT
  Filled 2019-03-12 (×8): qty 3

## 2019-03-12 MED ORDER — ONDANSETRON HCL 4 MG/2ML IJ SOLN: 4.0000 mg | Freq: Four times a day (QID) | INTRAMUSCULAR | Status: DC | PRN

## 2019-03-12 MED ORDER — POTASSIUM CHLORIDE CRYS ER 20 MEQ PO TBCR
20.0000 meq | EXTENDED_RELEASE_TABLET | Freq: Two times a day (BID) | ORAL | Status: DC
  Administered 2019-03-12 – 2019-03-14 (×4): 20 meq via ORAL
  Filled 2019-03-12 (×4): qty 1

## 2019-03-12 MED ORDER — NICOTINE 21 MG/24HR TD PT24
21.0000 mg | MEDICATED_PATCH | Freq: Once | TRANSDERMAL | Status: AC
  Administered 2019-03-12: 21 mg via TRANSDERMAL
  Filled 2019-03-12: qty 1

## 2019-03-12 MED ORDER — SODIUM CHLORIDE 0.9% FLUSH
3.0000 mL | Freq: Two times a day (BID) | INTRAVENOUS | Status: DC
  Administered 2019-03-12 – 2019-03-15 (×6): 3 mL via INTRAVENOUS

## 2019-03-12 MED ORDER — SODIUM CHLORIDE 0.9 % IV SOLN
500.0000 mg | INTRAVENOUS | Status: DC
  Administered 2019-03-12 – 2019-03-14 (×3): 500 mg via INTRAVENOUS
  Filled 2019-03-12 (×3): qty 500

## 2019-03-12 MED ORDER — METHYLPREDNISOLONE SODIUM SUCC 125 MG IJ SOLR
125.0000 mg | Freq: Once | INTRAMUSCULAR | Status: AC
  Administered 2019-03-12: 125 mg via INTRAVENOUS
  Filled 2019-03-12: qty 2

## 2019-03-12 MED ORDER — LISINOPRIL 10 MG PO TABS
10.0000 mg | ORAL_TABLET | Freq: Every day | ORAL | Status: DC
  Administered 2019-03-12 – 2019-03-15 (×4): 10 mg via ORAL
  Filled 2019-03-12 (×4): qty 1

## 2019-03-12 MED ORDER — IPRATROPIUM-ALBUTEROL 0.5-2.5 (3) MG/3ML IN SOLN
3.0000 mL | Freq: Four times a day (QID) | RESPIRATORY_TRACT | Status: DC
  Administered 2019-03-12: 3 mL via RESPIRATORY_TRACT
  Filled 2019-03-12: qty 3

## 2019-03-12 MED ORDER — SODIUM CHLORIDE 0.9 % IV SOLN: 250.0000 mL | INTRAVENOUS | Status: DC | PRN

## 2019-03-12 MED ORDER — ASPIRIN EC 81 MG PO TBEC
81.0000 mg | DELAYED_RELEASE_TABLET | Freq: Every day | ORAL | Status: DC
  Administered 2019-03-12 – 2019-03-15 (×4): 81 mg via ORAL
  Filled 2019-03-12 (×4): qty 1

## 2019-03-12 MED ORDER — PREDNISONE 20 MG PO TABS: 40.0000 mg | ORAL_TABLET | Freq: Every day | ORAL | Status: DC

## 2019-03-12 NOTE — H&P (Signed)
History and Physical  Brittany Bass QQV:[id number] DOB: 09-28-1950 DOA: 03/12/2019  Referring physician: Rodell Perna, ED physician PCP: Patient, No Pcp Per  Outpatient Specialists:   Patient Coming From: home  Chief Complaint: SOB, cough, leg swelling  HPI: Brittany Bass is a 68 y.o. female with no prior medical history. She presents with gradual worsening shortness of breath over the past couple of months, but more significantly worse over the past couple of weeks.  She has swelling in her legs bilaterally that have also been worsening.  Her shortness of breath is worse with exertion and improved with rest.  She does have mild orthopnea.  She is a smoker and smokes 1 pack/day currently.  She also has a productive cough with brown sputum.  This is increased from normal.  Denies fevers, chills, nausea, vomiting, sick contacts.  Emergency Department Course: BNP elevated to 197.  Troponin marginally positive at 0.03.  Chest x-ray shows mild pulmonary edema.  Patient desats to 86% on room air while ambulating short distance.  She also desats to 89% while talking at rest.  She was given a dose of Lasix, Solu-Medrol, albuterol.  She had some mild improvement with these interventions.  Review of Systems:   Pt denies any fevers, chills, nausea, vomiting, diarrhea, constipation, abdominal pain, palpitations, headache, vision changes, lightheadedness, dizziness, melena, rectal bleeding.  Review of systems are otherwise negative  History reviewed. No pertinent past medical history. Past Surgical History:  Procedure Laterality Date  . CESAREAN SECTION    . jaundice     Social History:  reports that she has been smoking cigarettes. She has been smoking about 1.00 pack per day. She has never used smokeless tobacco. She reports that she does not drink alcohol or use drugs. Patient lives at home  Allergies  Allergen Reactions  . Shellfish Allergy Anaphylaxis  . Penicillins Hives and Nausea And  Vomiting    .Did it involve swelling of the face/tongue/throat, SOB, or low BP? Yes Did it involve sudden or severe rash/hives, skin peeling, or any reaction on the inside of your mouth or nose? Yes Did you need to seek medical attention at a hospital or doctor's office? Yes When did it last happen? If all above answers are "NO", may proceed with cephalosporin use.     No family history on file.  Family history of cancer: Her sister has breast and esophageal cancer with metastatic disease.  Prior to Admission medications   Not on File    Physical Exam: BP 135/76   Pulse 75   Temp 98 F (36.7 C) (Oral)   Resp 17   Ht 5\' 2"  (1.575 m)   Wt 104.3 kg   SpO2 98%   BMI 42.07 kg/m   . General: Elderly Caucasian female. Awake and alert and oriented x3. No acute cardiopulmonary distress.  Marland Kitchen HEENT: Normocephalic atraumatic.  Right and left ears normal in appearance.  Pupils equal, round, reactive to light. Extraocular muscles are intact. Sclerae anicteric and noninjected.  Moist mucosal membranes. No mucosal lesions.  . Neck: Neck supple without lymphadenopathy. No carotid bruits. No masses palpated.  . Cardiovascular: Regular rate with normal S1-S2 sounds. No murmurs, rubs, gallops auscultated.  Increased JVD.  2+ pitting edema . Respiratory: Diminished and tight breath sounds throughout.  Rales in bases bilaterally. no accessory muscle use. . Abdomen: Soft, nontender, nondistended. Active bowel sounds. No masses or hepatosplenomegaly  . Skin: No rashes, lesions, or ulcerations.  Dry, warm to touch.  2+ dorsalis pedis and radial pulses. . Musculoskeletal: No calf or leg pain. All major joints not erythematous nontender.  No upper or lower joint deformation.  Good ROM.  No contractures  . Psychiatric: Intact judgment and insight. Pleasant and cooperative. . Neurologic: No focal neurological deficits. Strength is 5/5 and symmetric in upper and lower extremities.  Cranial nerves II  through XII are grossly intact.           Labs on Admission: I have personally reviewed following labs and imaging studies  CBC: Recent Labs  Lab 03/12/19 1604  WBC 9.2  NEUTROABS 7.0  HGB 15.1*  HCT 49.0*  MCV 99.6  PLT 654   Basic Metabolic Panel: Recent Labs  Lab 03/12/19 1704  NA 141  K 4.2  CL 101  CO2 32  GLUCOSE 89  BUN 17  CREATININE 0.98  CALCIUM 8.5*   GFR: Estimated Creatinine Clearance: 61.4 mL/min (by C-G formula based on SCr of 0.98 mg/dL). Liver Function Tests: No results for input(s): AST, ALT, ALKPHOS, BILITOT, PROT, ALBUMIN in the last 168 hours. No results for input(s): LIPASE, AMYLASE in the last 168 hours. No results for input(s): AMMONIA in the last 168 hours. Coagulation Profile: No results for input(s): INR, PROTIME in the last 168 hours. Cardiac Enzymes: Recent Labs  Lab 03/12/19 1704  TROPONINI 0.03*   BNP (last 3 results) No results for input(s): PROBNP in the last 8760 hours. HbA1C: No results for input(s): HGBA1C in the last 72 hours. CBG: No results for input(s): GLUCAP in the last 168 hours. Lipid Profile: No results for input(s): CHOL, HDL, LDLCALC, TRIG, CHOLHDL, LDLDIRECT in the last 72 hours. Thyroid Function Tests: No results for input(s): TSH, T4TOTAL, FREET4, T3FREE, THYROIDAB in the last 72 hours. Anemia Panel: No results for input(s): VITAMINB12, FOLATE, FERRITIN, TIBC, IRON, RETICCTPCT in the last 72 hours. Urine analysis:    Component Value Date/Time   COLORURINE YELLOW 01/19/2012 2202   APPEARANCEUR CLEAR 01/19/2012 2202   LABSPEC 1.015 01/19/2012 2202   PHURINE 5.5 01/19/2012 2202   GLUCOSEU NEGATIVE 01/19/2012 2202   HGBUR NEGATIVE 01/19/2012 2202   BILIRUBINUR NEGATIVE 01/19/2012 2202   KETONESUR NEGATIVE 01/19/2012 2202   PROTEINUR NEGATIVE 01/19/2012 2202   UROBILINOGEN 0.2 01/19/2012 2202   NITRITE NEGATIVE 01/19/2012 2202   LEUKOCYTESUR NEGATIVE 01/19/2012 2202   Sepsis Labs:  @LABRCNTIP (procalcitonin:4,lacticidven:4) ) Recent Results (from the past 240 hour(s))  SARS Coronavirus 2 (CEPHEID- Performed in [place] hospital lab), Hosp Order     Status: None   Collection Time: 03/12/19  3:47 PM   Specimen: Nasopharyngeal Swab  Result Value Ref Range Status   SARS Coronavirus 2 NEGATIVE NEGATIVE Final    Comment: (NOTE) If result is NEGATIVE SARS-CoV-2 target nucleic acids are NOT DETECTED. The SARS-CoV-2 RNA is generally detectable in upper and lower  respiratory specimens during the acute phase of infection. The lowest  concentration of SARS-CoV-2 viral copies this assay can detect is 250  copies / mL. A negative result does not preclude SARS-CoV-2 infection  and should not be used as the sole basis for treatment or other  patient management decisions.  A negative result may occur with  improper specimen collection / handling, submission of specimen other  than nasopharyngeal swab, presence of viral mutation(s) within the  areas targeted by this assay, and inadequate number of viral copies  (<250 copies / mL). A negative result must be combined with clinical  observations, patient history, and epidemiological information. If result  is POSITIVE SARS-CoV-2 target nucleic acids are DETECTED. The SARS-CoV-2 RNA is generally detectable in upper and lower  respiratory specimens dur ing the acute phase of infection.  Positive  results are indicative of active infection with SARS-CoV-2.  Clinical  correlation with patient history and other diagnostic information is  necessary to determine patient infection status.  Positive results do  not rule out bacterial infection or co-infection with other viruses. If result is PRESUMPTIVE POSTIVE SARS-CoV-2 nucleic acids MAY BE PRESENT.   A presumptive positive result was obtained on the submitted specimen  and confirmed on repeat testing.  While 2019 novel coronavirus  (SARS-CoV-2) nucleic acids may be present in the  submitted sample  additional confirmatory testing may be necessary for epidemiological  and / or clinical management purposes  to differentiate between  SARS-CoV-2 and other Sarbecovirus currently known to infect humans.  If clinically indicated additional testing with an alternate test  methodology [phone]) is advised. The SARS-CoV-2 RNA is generally  detectable in upper and lower respiratory sp ecimens during the acute  phase of infection. The expected result is Negative. Fact Sheet for Patients:  StrictlyIdeas.no Fact Sheet for Healthcare Providers: BankingDealers.co.za This test is not yet approved or cleared by the [place] FDA and has been authorized for detection and/or diagnosis of SARS-CoV-2 by FDA under an Emergency Use Authorization (EUA).  This EUA will remain in effect (meaning this test can be used) for the duration of the COVID-19 declaration under Section 564(b)(1) of the Act, 21 U.S.C. section 360bbb-3(b)(1), unless the authorization is terminated or revoked sooner. Performed at [facility], [address], [place], [place] [address]      Radiological Exams on Admission: Dg Chest 2 View  Result Date: 03/12/2019 CLINICAL DATA:  Short of breath with leg swelling EXAM: CHEST - 2 VIEW COMPARISON:  12/22/2016 FINDINGS: Cardiomegaly with vascular congestion. Hazy and interstitial opacity greatest at the bases. No large effusion. Aortic atherosclerosis. No pneumothorax. IMPRESSION: Cardiomegaly with mild vascular congestion and hazy opacity at the bases suspect for mild edema. Electronically Signed   By: Donavan Foil M.D.   On: 03/12/2019 15:27    EKG: Independently reviewed.  Sinus rhythm with a right axis deviation.  Borderline T wave abnormalities.  No acute ST changes.  Assessment/Plan: Active Problems:   Acute respiratory failure with hypoxia (HCC)   COPD with acute exacerbation (HCC)   Acute CHF (congestive heart  failure) ([place])   Obesity   Essential hypertension    This patient was discussed with the ED physician, including pertinent vitals, physical exam findings, labs, and imaging.  We also discussed care given by the ED provider.  1. Acute respiratory failure with hypoxia a. Maintain oxygen saturations greater than 90%. 2. COPD with acute exacerbation a. Solu-Medrol b. Nebulizer treatments c. Azithromycin 3. Acute CHF Telemetry monitoring Strict I/O Daily Weights Diuresis: Lasix 40 mg IV twice daily Potassium: 40 mEq twice a day by mouth Echo cardiac exam tomorrow Repeat BMP tomorrow 4. Hypertension a. -Lisinopril 10 mg 5. Obesity  DVT prophylaxis: Lovenox Consultants: None Code Status: Full code Family Communication: None Disposition Plan: Patient to return home following admission   Truett Mainland, DO

## 2019-03-12 NOTE — ED Provider Notes (Signed)
[facility] EMERGENCY DEPARTMENT Provider Note   CSN: [id number] Arrival date & time: 03/12/19  1355    History   Chief Complaint Chief Complaint  Patient presents with   Leg Swelling    HPI Brittany Bass is a 68 y.o. female presents for evaluation of gradually worsening bilateral lower extremity edema and shortness of breath.  She reports that the lower extremity edema has been present since March but has been worsening, improved somewhat with elevation.  She notes dyspnea on exertion sometimes shortness of breath at rest.  Denies chest pain.  Also reports a productive cough that has been worsening over the past couple of weeks but denies fever.  No abdominal pain, nausea, or vomiting.  She is a smoker of approximately half a pack to a pack of cigarettes daily but states that she is trying to quit and has been reducing her smoking.     The history is provided by the patient.    History reviewed. No pertinent past medical history.  Patient Active Problem List   Diagnosis Date Noted   Acute respiratory failure with hypoxia ([place]) 03/12/2019   COPD with acute exacerbation ([place]) 03/12/2019   Acute CHF (congestive heart failure) ([place]) 03/12/2019   Obesity 03/12/2019   Essential hypertension 03/12/2019    Past Surgical History:  Procedure Laterality Date   CESAREAN SECTION     jaundice       OB History   No obstetric history on file.      Home Medications    Prior to Admission medications   Not on File    Family History No family history on file.  Social History Social History   Tobacco Use   Smoking status: Current Every Day Smoker    Packs/day: 1.00    Types: Cigarettes   Smokeless tobacco: Never Used  Substance Use Topics   Alcohol use: No   Drug use: No     Allergies   Shellfish allergy and Penicillins   Review of Systems Review of Systems  Constitutional: Negative for chills and fever.  Respiratory: Positive for cough and  shortness of breath.   Cardiovascular: Positive for leg swelling. Negative for chest pain.  Gastrointestinal: Negative for abdominal pain, nausea and vomiting.  All other systems reviewed and are negative.    Physical Exam Updated Vital Signs BP (!) 150/71 (BP Location: Right Arm)    Pulse 63    Temp 98 F (36.7 C) (Oral)    Resp 20    Ht 5\' 2"  (1.575 m)    Wt 104.4 kg    SpO2 92%    BMI 42.10 kg/m   Physical Exam Vitals signs and nursing note reviewed.  Constitutional:      General: She is not in acute distress.    Appearance: She is well-developed.  HENT:     Head: Normocephalic and atraumatic.  Eyes:     General:        Right eye: No discharge.        Left eye: No discharge.     Conjunctiva/sclera: Conjunctivae normal.  Neck:     Vascular: No JVD.     Trachea: No tracheal deviation.  Cardiovascular:     Rate and Rhythm: Normal rate and regular rhythm.     Pulses: Normal pulses.     Comments: 2+ DP/PT pulses bilaterally.  2+ pitting edema of the bilateral lower extremities.  Homans sign absent bilaterally.  Compartments are soft. Pulmonary:  Effort: Pulmonary effort is normal.     Comments: Globally diminished breath sounds.  SPO2 saturations 89% on room air, improved on 2 L via nasal cannula up to 96% Abdominal:     General: Abdomen is protuberant. There is no distension.     Palpations: Abdomen is soft.     Tenderness: There is no abdominal tenderness. There is no guarding or rebound.  Skin:    General: Skin is warm and dry.     Findings: No erythema.  Neurological:     Mental Status: She is alert.  Psychiatric:        Behavior: Behavior normal.      ED Treatments / Results  Labs (all labs ordered are listed, but only abnormal results are displayed) Labs Reviewed  BRAIN NATRIURETIC PEPTIDE - Abnormal; Notable for the following components:      Result Value   B Natriuretic Peptide 197.0 (*)    All other components within normal limits  CBC WITH  DIFFERENTIAL/PLATELET - Abnormal; Notable for the following components:   Hemoglobin 15.1 (*)    HCT 49.0 (*)    All other components within normal limits  BASIC METABOLIC PANEL - Abnormal; Notable for the following components:   Calcium 8.5 (*)    GFR calc non Af Amer 59 (*)    All other components within normal limits  TROPONIN I - Abnormal; Notable for the following components:   Troponin I 0.03 (*)    All other components within normal limits  SARS CORONAVIRUS 2 (HOSPITAL ORDER, [place] [facility])  HIV ANTIBODY (ROUTINE TESTING W REFLEX)  BASIC METABOLIC PANEL    EKG EKG Interpretation  Date/Time:  Saturday March 12 2019 14:23:25 EDT Ventricular Rate:  83 PR Interval:    QRS Duration: 74 QT Interval:  493 QTC Calculation: 580 R Axis:   114 Text Interpretation:  Sinus rhythm Right axis deviation Low voltage, precordial leads Borderline T abnormalities, anterior leads Prolonged QT interval No previous tracing Confirmed by Fredia Sorrow [phone]) on 03/12/2019 3:30:04 PM   Radiology Dg Chest 2 View  Result Date: 03/12/2019 CLINICAL DATA:  Short of breath with leg swelling EXAM: CHEST - 2 VIEW COMPARISON:  12/22/2016 FINDINGS: Cardiomegaly with vascular congestion. Hazy and interstitial opacity greatest at the bases. No large effusion. Aortic atherosclerosis. No pneumothorax. IMPRESSION: Cardiomegaly with mild vascular congestion and hazy opacity at the bases suspect for mild edema. Electronically Signed   By: Donavan Foil M.D.   On: 03/12/2019 15:27    Procedures .Critical Care Performed by: Renita Papa, PA-C Authorized by: Renita Papa, PA-C   Critical care provider statement:    Critical care time (minutes):  40   Critical care was necessary to treat or prevent imminent or life-threatening deterioration of the following conditions:  Respiratory failure   Critical care was time spent personally by me on the following activities:  Discussions with  consultants, evaluation of patient's response to treatment, examination of patient, ordering and performing treatments and interventions, ordering and review of laboratory studies, ordering and review of radiographic studies, pulse oximetry, re-evaluation of patient's condition, obtaining history from patient or surrogate and review of old charts   (including critical care time)  Medications Ordered in ED Medications  nicotine (NICODERM CQ - dosed in mg/24 hours) patch 21 mg (21 mg Transdermal Patch Applied 03/12/19 1808)  azithromycin (ZITHROMAX) 500 mg in sodium chloride 0.9 % 250 mL IVPB (500 mg Intravenous New Bag/Given 03/12/19 1928)  ipratropium-albuterol (DUONEB) 0.5-2.5 (3) MG/3ML nebulizer solution 3 mL (has no administration in time range)  sodium chloride flush (NS) 0.9 % injection 3 mL (3 mLs Intravenous Given 03/12/19 2129)  sodium chloride flush (NS) 0.9 % injection 3 mL (has no administration in time range)  0.9 %  sodium chloride infusion (has no administration in time range)  acetaminophen (TYLENOL) tablet 650 mg (has no administration in time range)  ondansetron (ZOFRAN) injection 4 mg (has no administration in time range)  enoxaparin (LOVENOX) injection 40 mg (40 mg Subcutaneous Given 03/12/19 2127)  furosemide (LASIX) injection 40 mg (has no administration in time range)  potassium chloride SA (K-DUR) CR tablet 20 mEq (20 mEq Oral Given 03/12/19 2128)  lisinopril (ZESTRIL) tablet 10 mg (10 mg Oral Given 03/12/19 2129)  aspirin EC tablet 81 mg (81 mg Oral Given 03/12/19 2132)  nicotine (NICODERM CQ - dosed in mg/24 hours) patch 14 mg (has no administration in time range)  methylPREDNISolone sodium succinate (SOLU-MEDROL) 125 mg/2 mL injection 60 mg (has no administration in time range)    Followed by  predniSONE (DELTASONE) tablet 40 mg (has no administration in time range)  albuterol (PROVENTIL) (2.5 MG/3ML) 0.083% nebulizer solution 2.5 mg (has no administration in time range)    albuterol (VENTOLIN HFA) 108 (90 Base) MCG/ACT inhaler 4 puff (4 puffs Inhalation Given 03/12/19 1710)  furosemide (LASIX) injection 40 mg (40 mg Intravenous Given 03/12/19 1805)  methylPREDNISolone sodium succinate (SOLU-MEDROL) 125 mg/2 mL injection 125 mg (125 mg Intravenous Given 03/12/19 1926)     Initial Impression / Assessment and Plan / ED Course  I have reviewed the triage vital signs and the nursing notes.  Pertinent labs & imaging results that were available during my care of the patient were reviewed by me and considered in my medical decision making (see chart for details).        Patient presents for evaluation of progressively worsening bilateral lower extremity edema, shortness of breath, and cough.  All of these complaints have been present for the last few months but progressively worsening over the last couple of weeks.  She is afebrile, hypertensive in the ED.  On my assessment, her SPO2 saturations dropped to 89% while speaking to me so I placed her on supplemental oxygen with improvement.  She is globally diminished breath sounds.  Question underlying COPD, will give albuterol and Solu-Medrol.  Her EKG shows normal sinus rhythm and a prolonged QT interval but no ischemic abnormalities noted.  Lab work reviewed by me shows no leukocytosis but her hemoglobin and hematocrit are elevated which could be secondary to dehydration versus her history of tobacco abuse.  No metabolic derangements.  Her BNP is elevated at 197 and her troponin is marginally elevated as well at 0.03.  Chest x-ray shows cardiomegaly with mild vascular congestion.  She also has hazy opacities at the bases consistent with edema although infection was also considered.  Given her complaint of worsening cough, it would be reasonable to initiate antibiotics.  She was given IV Lasix in the ED.  She was also ambulated in the ED without any supplemental oxygen and her SPO2 saturations dropped to 86%.  I suspect her  primary problem is new onset CHF though there could be underlying COPD complicating this as well.  Doubt ACS/MI as she has no complaint of chest pain. Her COVID-19 test was negative.  I spoke with Dr. Sheran Lawless with the [facility] who agrees to assume care of patient and  bring her into the hospital for further evaluation and management.  Brittany Bass was evaluated in Emergency Department on 03/12/2019 for the symptoms described in the history of present illness. She was evaluated in the context of the global COVID-19 pandemic, which necessitated consideration that the patient might be at risk for infection with the SARS-CoV-2 virus that causes COVID-19. Institutional protocols and algorithms that pertain to the evaluation of patients at risk for COVID-19 are in a state of rapid change based on information released by regulatory bodies including the CDC and federal and state organizations. These policies and algorithms were followed during the patient's care in the ED.  Final Clinical Impressions(s) / ED Diagnoses   Final diagnoses:  Acute respiratory failure with hypoxia (HCC)  Congestive heart failure, unspecified HF chronicity, unspecified heart failure type [facility])    ED Discharge Orders    None       Debroah Baller 03/12/19 2144    Fredia Sorrow, MD 03/13/19 1843

## 2019-03-12 NOTE — ED Provider Notes (Signed)
Medical screening examination/treatment/procedure(s) were conducted as a shared visit with non-physician practitioner(s) and myself.  I personally evaluated the patient during the encounter.  EKG Interpretation  Date/Time:  Saturday March 12 2019 14:23:25 EDT Ventricular Rate:  83 PR Interval:    QRS Duration: 74 QT Interval:  493 QTC Calculation: 580 R Axis:   114 Text Interpretation:  Sinus rhythm Right axis deviation Low voltage, precordial leads Borderline T abnormalities, anterior leads Prolonged QT interval No previous tracing Confirmed by Fredia Sorrow [phone]) on 03/12/2019 3:30:04 PM  Results for orders placed or performed during the hospital encounter of 03/12/19  SARS Coronavirus 2 (CEPHEID- Performed in [place] hospital lab), [facility] Order   Specimen: Nasopharyngeal Swab  Result Value Ref Range   SARS Coronavirus 2 NEGATIVE NEGATIVE  Brain natriuretic peptide  Result Value Ref Range   B Natriuretic Peptide 197.0 (H) 0.0 - 100.0 pg/mL  CBC with Differential  Result Value Ref Range   WBC 9.2 4.0 - 10.5 K/uL   RBC 4.92 3.87 - 5.11 MIL/uL   Hemoglobin 15.1 (H) 12.0 - 15.0 g/dL   HCT 49.0 (H) 36.0 - 46.0 %   MCV 99.6 80.0 - 100.0 fL   MCH 30.7 26.0 - 34.0 pg   MCHC 30.8 30.0 - 36.0 g/dL   RDW 13.8 11.5 - 15.5 %   Platelets 217 150 - 400 K/uL   nRBC 0.0 0.0 - 0.2 %   Neutrophils Relative % 76 %   Neutro Abs 7.0 1.7 - 7.7 K/uL   Lymphocytes Relative 14 %   Lymphs Abs 1.3 0.7 - 4.0 K/uL   Monocytes Relative 9 %   Monocytes Absolute 0.8 0.1 - 1.0 K/uL   Eosinophils Relative 1 %   Eosinophils Absolute 0.1 0.0 - 0.5 K/uL   Basophils Relative 0 %   Basophils Absolute 0.0 0.0 - 0.1 K/uL   Immature Granulocytes 0 %   Abs Immature Granulocytes 0.04 0.00 - 0.07 K/uL  Basic metabolic panel  Result Value Ref Range   Sodium 141 135 - 145 mmol/L   Potassium 4.2 3.5 - 5.1 mmol/L   Chloride 101 98 - 111 mmol/L   CO2 32 22 - 32 mmol/L   Glucose, Bld 89 70 - 99 mg/dL   BUN 17  8 - 23 mg/dL   Creatinine, Ser 0.98 0.44 - 1.00 mg/dL   Calcium 8.5 (L) 8.9 - 10.3 mg/dL   GFR calc non Af Amer 59 (L) >60 mL/min   GFR calc Af Amer >60 >60 mL/min   Anion gap 8 5 - 15  Troponin I -  Result Value Ref Range   Troponin I 0.03 (HH) <0.03 ng/mL   Dg Chest 2 View  Result Date: 03/12/2019 CLINICAL DATA:  Short of breath with leg swelling EXAM: CHEST - 2 VIEW COMPARISON:  12/22/2016 FINDINGS: Cardiomegaly with vascular congestion. Hazy and interstitial opacity greatest at the bases. No large effusion. Aortic atherosclerosis. No pneumothorax. IMPRESSION: Cardiomegaly with mild vascular congestion and hazy opacity at the bases suspect for mild edema. Electronically Signed   By: Donavan Foil M.D.   On: 03/12/2019 15:27   Patient seen by me along with physician assistant.  Patient's been having some shortness of breath actually over the last several weeks.  We did seem to get worse here today.  Patient definitely has an oxygen requirement she is comfortable on 2 L.  But off of that she will desat down to around 88 to 87%.  Patient does not  use oxygen at home.  Chest x-ray consistent maybe with some pulmonary edema.  Treated here with Lasix.  Patient also very very to have a COPD component as well she received some steroids for that.  Patient will require admission due to the oxygen component.  Patient's troponin a little borderline at 0.03.  Patient not giving a good history for any acute cardiac event.  Patient BNP is also mildly elevated.  Patient nontoxic no acute distress.   Fredia Sorrow, MD 03/12/19 [phone]

## 2019-03-12 NOTE — ED Notes (Signed)
Lab called stating there wasn't enough blood in the CMP and troponin. Recollected at this time.

## 2019-03-12 NOTE — ED Triage Notes (Signed)
Pt c/o bilateral lower leg swelling and rash on lower extremities x 1 week. Pt also c/o worsening SOB, but states she is usually mildly SOB because she is a smoker.

## 2019-03-12 NOTE — ED Notes (Signed)
CRITICAL VALUE ALERT  Critical Value:  Troponin 0.03  Date & Time Notied:  03/12/2019 @1843   Provider Notified: Dr Rogene Houston  Orders Received/Actions taken: Orders to be given

## 2019-03-13 ENCOUNTER — Inpatient Hospital Stay (HOSPITAL_COMMUNITY): Payer: PPO

## 2019-03-13 DIAGNOSIS — I509 Heart failure, unspecified: Secondary | ICD-10-CM

## 2019-03-13 LAB — BASIC METABOLIC PANEL
Anion gap: 9 (ref 5–15)
BUN: 20 mg/dL (ref 8–23)
CO2: 33 mmol/L — ABNORMAL HIGH (ref 22–32)
Calcium: 8.3 mg/dL — ABNORMAL LOW (ref 8.9–10.3)
Chloride: 96 mmol/L — ABNORMAL LOW (ref 98–111)
Creatinine, Ser: 1.04 mg/dL — ABNORMAL HIGH (ref 0.44–1.00)
GFR calc Af Amer: >60 mL/min (ref >60)
GFR calc non Af Amer: 55 mL/min — ABNORMAL LOW (ref >60)
Glucose, Bld: 153 mg/dL — ABNORMAL HIGH (ref 70–99)
Potassium: 4.1 mmol/L (ref 3.5–5.1)
Sodium: 138 mmol/L (ref 135–145)

## 2019-03-13 LAB — ECHOCARDIOGRAM COMPLETE
Height: 62 in
Weight: 3682.56 oz

## 2019-03-13 MED ORDER — METHYLPREDNISOLONE SODIUM SUCC 40 MG IJ SOLR
40.0000 mg | Freq: Two times a day (BID) | INTRAMUSCULAR | Status: DC
  Administered 2019-03-13 – 2019-03-15 (×5): 40 mg via INTRAVENOUS
  Filled 2019-03-13 (×5): qty 1

## 2019-03-13 NOTE — Progress Notes (Signed)
*  PRELIMINARY RESULTS* Echocardiogram 2D Echocardiogram has been performed.  Leavy Cella 03/13/2019, 3:32 PM

## 2019-03-13 NOTE — Progress Notes (Addendum)
PROGRESS NOTE    Brittany Bass  QBH:[id number] DOB: [birth year] DOA: 03/12/2019 PCP: Patient, No Pcp Per   Brief Narrative:  Per HPI: Brittany Bass is a 68 y.o. female with no prior medical history. She presents with gradual worsening shortness of breath over the past couple of months, but more significantly worse over the past couple of weeks.  She has swelling in her legs bilaterally that have also been worsening.  Her shortness of breath is worse with exertion and improved with rest.  She does have mild orthopnea.  She is a smoker and smokes 1 pack/day currently.  She also has a productive cough with brown sputum.  This is increased from normal.  Denies fevers, chills, nausea, vomiting, sick contacts.  Patient was admitted with acute hypoxemic respiratory failure secondary to acute COPD exacerbation as well as CHF decompensation.  She has no prior diagnoses of such, and does not follow-up with a physician in the outpatient setting.  She does, however smoke on a daily basis and has done so for many years.  Assessment & Plan:   Active Problems:   Acute respiratory failure with hypoxia (HCC)   COPD with acute exacerbation (HCC)   Acute CHF (congestive heart failure) (HCC)   Obesity   Essential hypertension  Acute hypoxemic respiratory failure-multifactorial -Wean oxygen as tolerated -COVID negative  Acute COPD exacerbation -Discussed tobacco cessation nicotine patch offered -Wean Solu-Medrol today -Nebulizer treatments to as needed -Continue azithromycin  Pulmonary edema suspect secondary to CHF -Continue diuresis with Lasix 40 mg IV twice daily -Strict I's and O's as well as daily weights -2D echocardiogram -Follow repeat labs  Hypertension-controlled -Lisinopril 10 mg daily  Obesity -Lifestyle changes -We will need outpatient new PCP follow-up on discharge    DVT prophylaxis: Lovenox Code Status: Full Family Communication: None at bedside Disposition Plan:  Continue diuresis as well as breathing treatments as ordered.  Taper steroids today.  2D echocardiogram ordered.  Anticipate discharge in the next 24 hours if improved.  Wean oxygen.   Consultants:   None  Procedures:   None  Antimicrobials:   Azithromycin 6/20->   Subjective: Patient seen and evaluated today with no new acute complaints or concerns. No acute concerns or events noted overnight.  She states that she started to feel better, but continues to remain on 3 L nasal cannula.  Objective: Vitals:   03/12/19 2103 03/12/19 2152 03/13/19 0620 03/13/19 0810  BP: (!) 150/71  (!) 123/59   Pulse: 63  81   Resp: 20     Temp: 98 F (36.7 C)  98.4 F (36.9 C)   TempSrc: Oral  Oral   SpO2: 92% 93% 96% 92%  Weight:      Height:        Intake/Output Summary (Last 24 hours) at 03/13/2019 1046 Last data filed at 03/13/2019 0325 Gross per 24 hour  Intake 231.96 ml  Output 500 ml  Net -268.04 ml   Filed Weights   03/12/19 1410 03/12/19 2047  Weight: 104.3 kg 104.4 kg    Examination:  General exam: Appears calm and comfortable  Respiratory system: Clear to auscultation. Respiratory effort normal.  Currently on 3 L nasal cannula. Cardiovascular system: S1 & S2 heard, RRR. No JVD, murmurs, rubs, gallops or clicks.  Pitting edema 1+ noted bilaterally. Gastrointestinal system: Abdomen is nondistended, soft and nontender. No organomegaly or masses felt. Normal bowel sounds heard. Central nervous system: Alert and oriented. No focal neurological deficits. Extremities: Symmetric 5  x 5 power. Skin: No rashes, lesions or ulcers Psychiatry: Judgement and insight appear normal. Mood & affect appropriate.     Data Reviewed: I have personally reviewed following labs and imaging studies  CBC: Recent Labs  Lab 03/12/19 1604  WBC 9.2  NEUTROABS 7.0  HGB 15.1*  HCT 49.0*  MCV 99.6  PLT 989   Basic Metabolic Panel: Recent Labs  Lab 03/12/19 1704 03/13/19 0704  NA 141 138   K 4.2 4.1  CL 101 96*  CO2 32 33*  GLUCOSE 89 153*  BUN 17 20  CREATININE 0.98 1.04*  CALCIUM 8.5* 8.3*   GFR: Estimated Creatinine Clearance: 57.9 mL/min (A) (by C-G formula based on SCr of 1.04 mg/dL (H)). Liver Function Tests: No results for input(s): AST, ALT, ALKPHOS, BILITOT, PROT, ALBUMIN in the last 168 hours. No results for input(s): LIPASE, AMYLASE in the last 168 hours. No results for input(s): AMMONIA in the last 168 hours. Coagulation Profile: No results for input(s): INR, PROTIME in the last 168 hours. Cardiac Enzymes: Recent Labs  Lab 03/12/19 1704  TROPONINI 0.03*   BNP (last 3 results) No results for input(s): PROBNP in the last 8760 hours. HbA1C: No results for input(s): HGBA1C in the last 72 hours. CBG: No results for input(s): GLUCAP in the last 168 hours. Lipid Profile: No results for input(s): CHOL, HDL, LDLCALC, TRIG, CHOLHDL, LDLDIRECT in the last 72 hours. Thyroid Function Tests: No results for input(s): TSH, T4TOTAL, FREET4, T3FREE, THYROIDAB in the last 72 hours. Anemia Panel: No results for input(s): VITAMINB12, FOLATE, FERRITIN, TIBC, IRON, RETICCTPCT in the last 72 hours. Sepsis Labs: No results for input(s): PROCALCITON, LATICACIDVEN in the last 168 hours.  Recent Results (from the past 240 hour(s))  SARS Coronavirus 2 (CEPHEID- Performed in [place] hospital lab), Hosp Order     Status: None   Collection Time: 03/12/19  3:47 PM   Specimen: Nasopharyngeal Swab  Result Value Ref Range Status   SARS Coronavirus 2 NEGATIVE NEGATIVE Final    Comment: (NOTE) If result is NEGATIVE SARS-CoV-2 target nucleic acids are NOT DETECTED. The SARS-CoV-2 RNA is generally detectable in upper and lower  respiratory specimens during the acute phase of infection. The lowest  concentration of SARS-CoV-2 viral copies this assay can detect is 250  copies / mL. A negative result does not preclude SARS-CoV-2 infection  and should not be used as the sole  basis for treatment or other  patient management decisions.  A negative result may occur with  improper specimen collection / handling, submission of specimen other  than nasopharyngeal swab, presence of viral mutation(s) within the  areas targeted by this assay, and inadequate number of viral copies  (<250 copies / mL). A negative result must be combined with clinical  observations, patient history, and epidemiological information. If result is POSITIVE SARS-CoV-2 target nucleic acids are DETECTED. The SARS-CoV-2 RNA is generally detectable in upper and lower  respiratory specimens dur ing the acute phase of infection.  Positive  results are indicative of active infection with SARS-CoV-2.  Clinical  correlation with patient history and other diagnostic information is  necessary to determine patient infection status.  Positive results do  not rule out bacterial infection or co-infection with other viruses. If result is PRESUMPTIVE POSTIVE SARS-CoV-2 nucleic acids MAY BE PRESENT.   A presumptive positive result was obtained on the submitted specimen  and confirmed on repeat testing.  While 2019 novel coronavirus  (SARS-CoV-2) nucleic acids may be present in  the submitted sample  additional confirmatory testing may be necessary for epidemiological  and / or clinical management purposes  to differentiate between  SARS-CoV-2 and other Sarbecovirus currently known to infect humans.  If clinically indicated additional testing with an alternate test  methodology [phone]) is advised. The SARS-CoV-2 RNA is generally  detectable in upper and lower respiratory sp ecimens during the acute  phase of infection. The expected result is Negative. Fact Sheet for Patients:  StrictlyIdeas.no Fact Sheet for Healthcare Providers: BankingDealers.co.za This test is not yet approved or cleared by the [place] FDA and has been authorized for detection  and/or diagnosis of SARS-CoV-2 by FDA under an Emergency Use Authorization (EUA).  This EUA will remain in effect (meaning this test can be used) for the duration of the COVID-19 declaration under Section 564(b)(1) of the Act, 21 U.S.C. section 360bbb-3(b)(1), unless the authorization is terminated or revoked sooner. Performed at [facility], [address], [place], [place] [address]          Radiology Studies: Dg Chest 2 View  Result Date: 03/12/2019 CLINICAL DATA:  Short of breath with leg swelling EXAM: CHEST - 2 VIEW COMPARISON:  12/22/2016 FINDINGS: Cardiomegaly with vascular congestion. Hazy and interstitial opacity greatest at the bases. No large effusion. Aortic atherosclerosis. No pneumothorax. IMPRESSION: Cardiomegaly with mild vascular congestion and hazy opacity at the bases suspect for mild edema. Electronically Signed   By: Donavan Foil M.D.   On: 03/12/2019 15:27        Scheduled Meds: . aspirin EC  81 mg Oral Daily  . enoxaparin (LOVENOX) injection  40 mg Subcutaneous Q24H  . furosemide  40 mg Intravenous BID  . ipratropium-albuterol  3 mL Nebulization TID  . lisinopril  10 mg Oral Daily  . methylPREDNISolone (SOLU-MEDROL) injection  60 mg Intravenous Q6H   Followed by  . [START ON 03/14/2019] predniSONE  40 mg Oral Q breakfast  . nicotine  14 mg Transdermal Daily  . nicotine  21 mg Transdermal Once  . potassium chloride  20 mEq Oral BID  . sodium chloride flush  3 mL Intravenous Q12H   Continuous Infusions: . sodium chloride    . azithromycin Stopped (03/12/19 2025)     LOS: 1 day    Time spent: 30 minutes    Tanis Burnley Darleen Crocker, DO [facility] Pager [phone]  If 7PM-7AM, please contact night-coverage [email or web address] Password [facility] 03/13/2019, 10:46 AM

## 2019-03-14 LAB — BASIC METABOLIC PANEL
Anion gap: 12 (ref 5–15)
BUN: 30 mg/dL — ABNORMAL HIGH (ref 8–23)
CO2: 33 mmol/L — ABNORMAL HIGH (ref 22–32)
Calcium: 8.8 mg/dL — ABNORMAL LOW (ref 8.9–10.3)
Chloride: 94 mmol/L — ABNORMAL LOW (ref 98–111)
Creatinine, Ser: 1.12 mg/dL — ABNORMAL HIGH (ref 0.44–1.00)
GFR calc Af Amer: 58 mL/min — ABNORMAL LOW (ref >60)
GFR calc non Af Amer: 50 mL/min — ABNORMAL LOW (ref >60)
Glucose, Bld: 165 mg/dL — ABNORMAL HIGH (ref 70–99)
Potassium: 4.5 mmol/L (ref 3.5–5.1)
Sodium: 139 mmol/L (ref 135–145)

## 2019-03-14 LAB — CBC
HCT: 48.6 % — ABNORMAL HIGH (ref 36.0–46.0)
Hemoglobin: 15.1 g/dL — ABNORMAL HIGH (ref 12.0–15.0)
MCH: 30.7 pg (ref 26.0–34.0)
MCHC: 31.1 g/dL (ref 30.0–36.0)
MCV: 98.8 fL (ref 80.0–100.0)
Platelets: 254 10*3/uL (ref 150–400)
RBC: 4.92 MIL/uL (ref 3.87–5.11)
RDW: 14 % (ref 11.5–15.5)
WBC: 12.1 10*3/uL — ABNORMAL HIGH (ref 4.0–10.5)
nRBC: 0 % (ref 0.0–0.2)

## 2019-03-14 LAB — HIV ANTIBODY (ROUTINE TESTING W REFLEX): HIV Screen 4th Generation wRfx: NONREACTIVE

## 2019-03-14 LAB — MAGNESIUM: Magnesium: 2.3 mg/dL (ref 1.7–2.4)

## 2019-03-14 MED ORDER — BUDESONIDE 0.25 MG/2ML IN SUSP
0.2500 mg | Freq: Two times a day (BID) | RESPIRATORY_TRACT | Status: DC
  Administered 2019-03-14 – 2019-03-15 (×2): 0.25 mg via RESPIRATORY_TRACT
  Filled 2019-03-14 (×2): qty 2

## 2019-03-14 NOTE — Progress Notes (Signed)
PROGRESS NOTE    Brittany Bass  KVQ:[id number] DOB: [birth year] DOA: 03/12/2019 PCP: Patient, No Pcp Per   Brief Narrative:  Per HPI: Brittany Piekarski Hancockis a 68 y.o.femalewith no prior medical history. She presents withgradual worsening shortness of breath over the past couple of months, but more significantly worse over the past couple of weeks. She has swelling in her legs bilaterally that have also been worsening. Her shortness of breath is worse with exertion and improved with rest. She does have mild orthopnea. She is a smoker and smokes 1 pack/day currently. She also has a productive cough with brown sputum. This is increased from normal. Denies fevers, chills, nausea, vomiting, sick contacts.  Patient was admitted with acute hypoxemic respiratory failure secondary to acute COPD exacerbation as well as CHF decompensation.  She has no prior diagnoses of such, and does not follow-up with a physician in the outpatient setting.  She does, however smoke on a daily basis and has done so for many years.  She is making some progress with diuresis and IV steroids, but continues to remain on some nasal cannula oxygen.  2D echocardiogram with LVEF greater than 65% and some findings of impaired relaxation with possible diastolic dysfunction.   Assessment & Plan:   Active Problems:   Acute respiratory failure with hypoxia (HCC)   COPD with acute exacerbation (HCC)   Acute CHF (congestive heart failure) (HCC)   Obesity   Essential hypertension  Acute COPD exacerbation -Discussed tobacco cessation nicotine patch offered -Continue Solu-Medrol at current dose and add Pulmicort -Nebulizer treatments to as needed -Continue azithromycin  Pulmonary edema suspect secondary to CHF -Continue diuresis with Lasix 40 mg IV twice daily -Strict I's and O's as well as daily weights -2D echocardiogram with LVEF greater than 65% -Follow repeat labs  Hypertension-controlled -Lisinopril 10 mg daily   Obesity -Lifestyle changes -We will need outpatient new PCP follow-up on discharge    DVT prophylaxis: Lovenox Code Status: Full Family Communication: None at bedside Disposition Plan:  Continued need for IV diuresis as well as breathing treatments and IV steroids.  Needs to wean oxygen.  Anticipate discharge in the next 24 to 48 hours potentially with home oxygen if stable.   Consultants:   None  Procedures:   None  Antimicrobials:   Azithromycin 6/20->   Subjective: Patient seen and evaluated today with no new acute complaints or concerns. No acute concerns or events noted overnight.  She continues to have some ongoing shortness of breath and lower extremity edema, but is diuresing and appears to be improving, but not at baseline.  She denies any chest tightness or cough this morning.  She remains on 3 L nasal cannula.  Objective: Vitals:   03/13/19 1938 03/13/19 2238 03/14/19 0525 03/14/19 0744  BP:  (!) 102/43 125/65   Pulse:  71 78   Resp:  18 (!) 22   Temp:  97.6 F (36.4 C) 97.9 F (36.6 C)   TempSrc:  Oral Oral   SpO2: 94% 95% 95% 90%  Weight:   106.4 kg   Height:        Intake/Output Summary (Last 24 hours) at 03/14/2019 1239 Last data filed at 03/13/2019 1700 Gross per 24 hour  Intake 480 ml  Output -  Net 480 ml   Filed Weights   03/12/19 1410 03/12/19 2047 03/14/19 0525  Weight: 104.3 kg 104.4 kg 106.4 kg    Examination:  General exam: Appears calm and comfortable  Respiratory system: Clear  to auscultation. Respiratory effort normal.  Currently on 3 L nasal cannula Cardiovascular system: S1 & S2 heard, RRR. No JVD, murmurs, rubs, gallops or clicks.  Ongoing lower extremity edema noted. Gastrointestinal system: Abdomen is nondistended, soft and nontender. No organomegaly or masses felt. Normal bowel sounds heard. Central nervous system: Alert and oriented. No focal neurological deficits. Extremities: Symmetric 5 x 5 power. Skin: No  rashes, lesions or ulcers Psychiatry: Judgement and insight appear normal. Mood & affect appropriate.     Data Reviewed: I have personally reviewed following labs and imaging studies  CBC: Recent Labs  Lab 03/12/19 1604 03/14/19 0502  WBC 9.2 12.1*  NEUTROABS 7.0  --   HGB 15.1* 15.1*  HCT 49.0* 48.6*  MCV 99.6 98.8  PLT 217 027   Basic Metabolic Panel: Recent Labs  Lab 03/12/19 1704 03/13/19 0704 03/14/19 0502  NA 141 138 139  K 4.2 4.1 4.5  CL 101 96* 94*  CO2 32 33* 33*  GLUCOSE 89 153* 165*  BUN 17 20 30*  CREATININE 0.98 1.04* 1.12*  CALCIUM 8.5* 8.3* 8.8*  MG  --   --  2.3   GFR: Estimated Creatinine Clearance: 54.3 mL/min (A) (by C-G formula based on SCr of 1.12 mg/dL (H)). Liver Function Tests: No results for input(s): AST, ALT, ALKPHOS, BILITOT, PROT, ALBUMIN in the last 168 hours. No results for input(s): LIPASE, AMYLASE in the last 168 hours. No results for input(s): AMMONIA in the last 168 hours. Coagulation Profile: No results for input(s): INR, PROTIME in the last 168 hours. Cardiac Enzymes: Recent Labs  Lab 03/12/19 1704  TROPONINI 0.03*   BNP (last 3 results) No results for input(s): PROBNP in the last 8760 hours. HbA1C: No results for input(s): HGBA1C in the last 72 hours. CBG: No results for input(s): GLUCAP in the last 168 hours. Lipid Profile: No results for input(s): CHOL, HDL, LDLCALC, TRIG, CHOLHDL, LDLDIRECT in the last 72 hours. Thyroid Function Tests: No results for input(s): TSH, T4TOTAL, FREET4, T3FREE, THYROIDAB in the last 72 hours. Anemia Panel: No results for input(s): VITAMINB12, FOLATE, FERRITIN, TIBC, IRON, RETICCTPCT in the last 72 hours. Sepsis Labs: No results for input(s): PROCALCITON, LATICACIDVEN in the last 168 hours.  Recent Results (from the past 240 hour(s))  SARS Coronavirus 2 (CEPHEID- Performed in [place] hospital lab), Hosp Order     Status: None   Collection Time: 03/12/19  3:47 PM   Specimen:  Nasopharyngeal Swab  Result Value Ref Range Status   SARS Coronavirus 2 NEGATIVE NEGATIVE Final    Comment: (NOTE) If result is NEGATIVE SARS-CoV-2 target nucleic acids are NOT DETECTED. The SARS-CoV-2 RNA is generally detectable in upper and lower  respiratory specimens during the acute phase of infection. The lowest  concentration of SARS-CoV-2 viral copies this assay can detect is 250  copies / mL. A negative result does not preclude SARS-CoV-2 infection  and should not be used as the sole basis for treatment or other  patient management decisions.  A negative result may occur with  improper specimen collection / handling, submission of specimen other  than nasopharyngeal swab, presence of viral mutation(s) within the  areas targeted by this assay, and inadequate number of viral copies  (<250 copies / mL). A negative result must be combined with clinical  observations, patient history, and epidemiological information. If result is POSITIVE SARS-CoV-2 target nucleic acids are DETECTED. The SARS-CoV-2 RNA is generally detectable in upper and lower  respiratory specimens dur ing the  acute phase of infection.  Positive  results are indicative of active infection with SARS-CoV-2.  Clinical  correlation with patient history and other diagnostic information is  necessary to determine patient infection status.  Positive results do  not rule out bacterial infection or co-infection with other viruses. If result is PRESUMPTIVE POSTIVE SARS-CoV-2 nucleic acids MAY BE PRESENT.   A presumptive positive result was obtained on the submitted specimen  and confirmed on repeat testing.  While 2019 novel coronavirus  (SARS-CoV-2) nucleic acids may be present in the submitted sample  additional confirmatory testing may be necessary for epidemiological  and / or clinical management purposes  to differentiate between  SARS-CoV-2 and other Sarbecovirus currently known to infect humans.  If clinically  indicated additional testing with an alternate test  methodology [phone]) is advised. The SARS-CoV-2 RNA is generally  detectable in upper and lower respiratory sp ecimens during the acute  phase of infection. The expected result is Negative. Fact Sheet for Patients:  StrictlyIdeas.no Fact Sheet for Healthcare Providers: BankingDealers.co.za This test is not yet approved or cleared by the [place] FDA and has been authorized for detection and/or diagnosis of SARS-CoV-2 by FDA under an Emergency Use Authorization (EUA).  This EUA will remain in effect (meaning this test can be used) for the duration of the COVID-19 declaration under Section 564(b)(1) of the Act, 21 U.S.C. section 360bbb-3(b)(1), unless the authorization is terminated or revoked sooner. Performed at [facility], [address], [place], [place] [address]          Radiology Studies: Dg Chest 2 View  Result Date: 03/12/2019 CLINICAL DATA:  Short of breath with leg swelling EXAM: CHEST - 2 VIEW COMPARISON:  12/22/2016 FINDINGS: Cardiomegaly with vascular congestion. Hazy and interstitial opacity greatest at the bases. No large effusion. Aortic atherosclerosis. No pneumothorax. IMPRESSION: Cardiomegaly with mild vascular congestion and hazy opacity at the bases suspect for mild edema. Electronically Signed   By: Donavan Foil M.D.   On: 03/12/2019 15:27        Scheduled Meds: . aspirin EC  81 mg Oral Daily  . enoxaparin (LOVENOX) injection  40 mg Subcutaneous Q24H  . furosemide  40 mg Intravenous BID  . ipratropium-albuterol  3 mL Nebulization TID  . lisinopril  10 mg Oral Daily  . methylPREDNISolone (SOLU-MEDROL) injection  40 mg Intravenous Q12H  . nicotine  14 mg Transdermal Daily  . potassium chloride  20 mEq Oral BID  . sodium chloride flush  3 mL Intravenous Q12H   Continuous Infusions: . sodium chloride    . azithromycin 500 mg (03/13/19 1832)      LOS: 2 days    Time spent: 30 minutes    Deanda Ruddell Darleen Crocker, DO [facility] Pager [phone]  If 7PM-7AM, please contact night-coverage [email or web address] Password [facility] 03/14/2019, 12:39 PM

## 2019-03-14 NOTE — Care Management Important Message (Signed)
Important Message  Patient Details  Name: Brittany Bass MRN: [id number] Date of Birth: Aug 02, 1951   Medicare Important Message Given:  Yes     Tommy Medal 03/14/2019, 2:59 PM

## 2019-03-14 NOTE — Evaluation (Signed)
Physical Therapy Evaluation Patient Details Name: Brittany Bass MRN: [id number] DOB: 26-Nov-1950 Today's Date: 03/14/2019   History of Present Illness  Brittany Bass is a 68 y.o. female with no prior medical history. She presents with gradual worsening shortness of breath over the past couple of months, but more significantly worse over the past couple of weeks.  She has swelling in her legs bilaterally that have also been worsening.  Her shortness of breath is worse with exertion and improved with rest.  She does have mild orthopnea.  She is a smoker and smokes 1 pack/day currently.  She also has a productive cough with brown sputum.  This is increased from normal.  Denies fevers, chills, nausea, vomiting, sick contacts.    Clinical Impression  Patient functioning at baseline for functional mobility and gait.  Patient on room air during gait training with SpO2 averaging 93%, dropped to 87% after walking up/down ramps, required approximately 3-4 minute standing rest break to recover to 93%.  Plan:  Patient discharged from physical therapy to care of nursing for ambulation daily as tolerated for length of stay.     Follow Up Recommendations No PT follow up    Equipment Recommendations  None recommended by PT    Recommendations for Other Services       Precautions / Restrictions Precautions Precautions: None Restrictions Weight Bearing Restrictions: No      Mobility  Bed Mobility Overal bed mobility: Independent                Transfers Overall transfer level: Independent                  Ambulation/Gait Ambulation/Gait assistance: Modified independent (Device/Increase time) Gait Distance (Feet): 200 Feet Assistive device: None Gait Pattern/deviations: WFL(Within Functional Limits) Gait velocity: near normal   General Gait Details: Patient demonstrates good return for ambulation on level, inclined and declined surfaces without loss of balance, on room air with  SpO2 averaging 93%, SpO2 dropped to 87% after going up/down ramp, recovered to 93% after standing rest break  Stairs Stairs: Yes Stairs assistance: Modified independent (Device/Increase time) Stair Management: One rail Left;Alternating pattern Number of Stairs: 9 General stair comments: demonstrates good return for going up/down stairs without loss of balance using 1 siderail  Wheelchair Mobility    Modified Rankin (Stroke Patients Only)       Balance Overall balance assessment: No apparent balance deficits (not formally assessed)                                           Pertinent Vitals/Pain Pain Assessment: No/denies pain    Home Living Family/patient expects to be discharged to:: Private residence Living Arrangements: Children Available Help at Discharge: Family;Available PRN/intermittently Type of Home: House Home Access: Level entry     Home Layout: Two level;Able to live on main level with bedroom/bathroom;Full bath on main level Home Equipment: None      Prior Function Level of Independence: Independent         Comments: Hydrographic surveyor, 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     Hand Dominance        Extremity/Trunk Assessment   Upper Extremity Assessment Upper Extremity Assessment: Defer to OT evaluation    Lower Extremity Assessment Lower Extremity Assessment: Overall WFL for tasks assessed    Cervical / Trunk Assessment Cervical / Trunk  Assessment: Normal  Communication   Communication: No difficulties  Cognition Arousal/Alertness: Awake/alert Behavior During Therapy: WFL for tasks assessed/performed Overall Cognitive Status: Within Functional Limits for tasks assessed                                        General Comments      Exercises     Assessment/Plan    PT Assessment Patent does not need any further PT services  PT Problem List         PT Treatment Interventions      PT  Goals (Current goals can be found in the Care Plan section)  Acute Rehab PT Goals Patient Stated Goal: return home PT Goal Formulation: With patient Time For Goal Achievement: 03/14/19 Potential to Achieve Goals: Good    Frequency     Barriers to discharge        Co-evaluation               AM-PAC PT "6 Clicks" Mobility  Outcome Measure Help needed turning from your back to your side while in a flat bed without using bedrails?: None Help needed moving from lying on your back to sitting on the side of a flat bed without using bedrails?: None Help needed moving to and from a bed to a chair (including a wheelchair)?: None Help needed standing up from a chair using your arms (e.g., wheelchair or bedside chair)?: None Help needed to walk in hospital room?: None Help needed climbing 3-5 steps with a railing? : None 6 Click Score: 24    End of Session   Activity Tolerance: Patient tolerated treatment well;Patient limited by lethargy Patient left: in bed;with call bell/phone within reach;Other (comment)(seated at bedside)   PT Visit Diagnosis: Unsteadiness on feet (R26.81);Other abnormalities of gait and mobility (R26.89)    Time: 6440-3474 PT Time Calculation (min) (ACUTE ONLY): 17 min   Charges:   PT Evaluation $PT Eval Low Complexity: 1 Low PT Treatments $Gait Training: 8-22 mins        2:49 PM, 03/14/19 Lonell Grandchild, MPT Physical Therapist with [facility] [phone] office [phone] mobile phone

## 2019-03-14 NOTE — Progress Notes (Signed)
Initial Nutrition Assessment  DOCUMENTATION CODES:   Morbid obesity  INTERVENTION:  Nutrition Education: Heart Healthy Nutrition Therapy and Weight Loss Tips Handouts- reviewed with patient and provided her with copies.   NUTRITION DIAGNOSIS:   Food and nutrition related knowledge deficit related to limited prior education as evidenced by per patient/family report.   GOAL:  (Patient will follow Heart Healthy diet and limit excess caloric intake)   MONITOR: diet compliance and weight trends    REASON FOR ASSESSMENT:  Consult Assessment of nutrition requirement/status    ASSESSMENT:  Patient is a very pleasant obese 68 yo female with hx of COPD, CHF and hypertension. She presents with acute CHF, acute respiratory failure. Tobacco smoker.   Home diet is regular but patient uses [employer]ite [employer] to cook with. She lives with her daughter and 2 grandchildren. Patient helps with the household cooking and has some understanding about limiting sodium because he dad followed a low sodium diet. Provided her with handouts Heart Healthy Nutrition therapy and Weight loss tips. Patient expressed interest in decreasing body weight and making adjustments to daily food intake. 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 Patient says they have been eating out more recently since she has not felt like cooking.    Patient usual weight 104 kg. She was 106.4 kg with BLE edema on admission but says her feet and ankles look more like normal today.  Medications reviewed and include: Lasix, K-DUR, solu-medrol, Nicoderm- antibiotic (zithromax).   Labs: mag 2.3 wnl, WBC-12.1 BMP Latest Ref Rng & Units 03/14/2019 03/13/2019 03/12/2019  Glucose 70 - 99 mg/dL 165(H) 153(H) 89  BUN 8 - 23 mg/dL 30(H) 20 17  Creatinine 0.44 - 1.00 mg/dL 1.12(H) 1.04(H) 0.98  Sodium 135 - 145 mmol/L 139 138 141  Potassium 3.5 - 5.1 mmol/L 4.5 4.1 4.2  Chloride 98 - 111 mmol/L 94(L) 96(L) 101  CO2 22 - 32  mmol/L 33(H) 33(H) 32  Calcium 8.9 - 10.3 mg/dL 8.8(L) 8.3(L) 8.5(L)     NUTRITION - FOCUSED PHYSICAL EXAM:  Unable to complete Nutrition-Focused physical exam at this time.    Diet Order:   Diet Order            Diet Heart Room service appropriate? Yes; Fluid consistency: Thin; Fluid restriction: 1500 mL Fluid  Diet effective now              EDUCATION NEEDS:    Skin:  Skin Assessment: Reviewed RN Assessment(MASD- groin)  Last BM:  6/19  Height:   Ht Readings from Last 1 Encounters:  03/12/19 5\' 2"  (1.575 m)    Weight:   Wt Readings from Last 1 Encounters:  03/14/19 106.4 kg    Ideal Body Weight:  50 kg  BMI:  Body mass index is 42.9 kg/m.  Estimated Nutritional Needs:   Kcal:  1500-1600 (MSJ-+/- 50 kcal)  Protein:  75-80 gr  Fluid:  <2000 ml daily   Colman Cater MS,RD,CSG,LDN Office: [phone] Pager: [phone]

## 2019-03-14 NOTE — TOC Transition Note (Addendum)
Transition of Care [facility]) - CM/SW Discharge Note   Patient Details  Name: Brittany Bass MRN: [id number] Date of Birth: May 05, 1951  [facility] CM/SW Contact:  Ryane Konieczny, Chauncey Reading, RN Phone Number: 03/14/2019, 3:01 PM   Clinical Narrative:   CHF/COPD. From home, lives with daughter. No DME at home. Independent. Has recently applied to become new patient with Dr. Delphina Cahill. CM called to verify, they do have paperwork and awaiting decision from Dr. Deborha Payment will call patient with decision and will schedule appt at that time.  Patient is [facility] eligible, discussed this benefit with patient, she is familiar and agreeable to referral.  Will provide [facility] pamphlet and PCP list, should patient need it. Has been seen by PT, no PT follow up needed.       Readmission Risk Interventions No flowsheet data found.

## 2019-03-14 NOTE — Progress Notes (Signed)
OT Screen Note  Patient Details Name: Brittany Bass MRN: [id number] DOB: Jun 18, 1951   Cancelled Treatment:    Reason Eval/Treat Not Completed: OT screened, no needs identified, will sign off. Patient is currently functioning at baseline and is independent with all ADL tasks and functional transfers. No further OT needs at this time. Thank you for the referral.  Ailene Ravel, OTR/L,CBIS  [phone]  03/14/2019, 3:27 PM

## 2019-03-15 ENCOUNTER — Other Ambulatory Visit: Payer: Self-pay

## 2019-03-15 LAB — BASIC METABOLIC PANEL
Anion gap: 8 (ref 5–15)
BUN: 34 mg/dL — ABNORMAL HIGH (ref 8–23)
CO2: 37 mmol/L — ABNORMAL HIGH (ref 22–32)
Calcium: 8.9 mg/dL (ref 8.9–10.3)
Chloride: 96 mmol/L — ABNORMAL LOW (ref 98–111)
Creatinine, Ser: 1.06 mg/dL — ABNORMAL HIGH (ref 0.44–1.00)
GFR calc Af Amer: >60 mL/min (ref >60)
GFR calc non Af Amer: 54 mL/min — ABNORMAL LOW (ref >60)
Glucose, Bld: 138 mg/dL — ABNORMAL HIGH (ref 70–99)
Potassium: 4.6 mmol/L (ref 3.5–5.1)
Sodium: 141 mmol/L (ref 135–145)

## 2019-03-15 LAB — MAGNESIUM: Magnesium: 2.5 mg/dL — ABNORMAL HIGH (ref 1.7–2.4)

## 2019-03-15 LAB — CBC
HCT: 51.1 % — ABNORMAL HIGH (ref 36.0–46.0)
Hemoglobin: 15.7 g/dL — ABNORMAL HIGH (ref 12.0–15.0)
MCH: 30.6 pg (ref 26.0–34.0)
MCHC: 30.7 g/dL (ref 30.0–36.0)
MCV: 99.6 fL (ref 80.0–100.0)
Platelets: 245 10*3/uL (ref 150–400)
RBC: 5.13 MIL/uL — ABNORMAL HIGH (ref 3.87–5.11)
RDW: 13.9 % (ref 11.5–15.5)
WBC: 9.5 10*3/uL (ref 4.0–10.5)
nRBC: 0 % (ref 0.0–0.2)

## 2019-03-15 MED ORDER — FUROSEMIDE 20 MG PO TABS: 20.0000 mg | ORAL_TABLET | Freq: Every day | ORAL | 11 refills | Status: AC

## 2019-03-15 MED ORDER — COMBIVENT RESPIMAT 20-100 MCG/ACT IN AERS: 1.0000 | INHALATION_SPRAY | Freq: Four times a day (QID) | RESPIRATORY_TRACT | 3 refills | Status: AC | PRN

## 2019-03-15 MED ORDER — PREDNISONE 20 MG PO TABS: 40.0000 mg | ORAL_TABLET | Freq: Every day | ORAL | 0 refills | Status: AC

## 2019-03-15 MED ORDER — ASPIRIN 81 MG PO TBEC: 81.0000 mg | DELAYED_RELEASE_TABLET | Freq: Every day | ORAL | 0 refills | Status: AC

## 2019-03-15 NOTE — Progress Notes (Signed)
Denies shortness of breath and edema in ankles improving.  IV removed and discharge instructions reviewed.  Family to drive home

## 2019-03-15 NOTE — Discharge Summary (Signed)
Physician Discharge Summary  Brittany Bass RJJ:[id number] DOB: 09-20-1951 DOA: 03/12/2019  PCP: Patient, No Pcp Per  Admit date: 03/12/2019  Discharge date: 03/15/2019  Admitted From:Home  Disposition:  Home  Recommendations for Outpatient Follow-up:  1. Follow up with PCP in 1-2 weeks with list of new providers given.  Patient is trying to set up with Dr. Nevada Crane. 2. Remain on Lasix 20 mg daily as prescribed for now with follow-up BMP at new visit. 3. Remain on prednisone taper as prescribed 4. Combivent given as needed for shortness of breath or wheezing. 5. Patient extensively counseled on smoking cessation and family members assisting with nicotine patch and gum.  Home Health: None  Equipment/Devices: None  Discharge Condition: Stable  CODE STATUS: Full  Diet recommendation: Heart Healthy  Brief/Interim Summary: Per HPI: Brittany Cinco Hancockis a 68 y.o.femalewith no prior medical history. She presents withgradual worsening shortness of breath over the past couple of months, but more significantly worse over the past couple of weeks. She has swelling in her legs bilaterally that have also been worsening. Her shortness of breath is worse with exertion and improved with rest. She does have mild orthopnea. She is a smoker and smokes 1 pack/day currently. She also has a productive cough with brown sputum. This is increased from normal. Denies fevers, chills, nausea, vomiting, sick contacts.  Patient was admitted with acute hypoxemic respiratory failure secondary to acute COPD exacerbation as well as CHF decompensation. She has no prior diagnoses of such, and does not follow-up with a physician in the outpatient setting. She does, however smoke on a daily basis and has done so for many years.  She is making some progress with diuresis and IV steroids, but continues to remain on some nasal cannula oxygen.  2D echocardiogram with LVEF greater than 65% and some findings of impaired  relaxation with possible diastolic dysfunction.  Patient has been aggressively diuresed with IV Lasix and is overall feeling much better and is back to her usual baseline with no significant shortness of breath noted and much less lower extremity edema.  She still has some persistence of edema in her ankles and will be discharged with Lasix 20 mg daily for now and will require repeat evaluation with her new PCP as well as repeat labs with BMP.  She will additionally remain on prednisone taper and will be given Combivent as needed for shortness of breath or wheezing.  She has been counseled extensively on smoking cessation and is agreeable and has a plan in place to help her at home.  No other acute events noted during the course of this admission.  She agrees to follow-up with new PCP provider in the next few weeks.  Discharge Diagnoses:  Active Problems:   Acute respiratory failure with hypoxia (HCC)   COPD with acute exacerbation (HCC)   Acute CHF (congestive heart failure) (HCC)   Obesity   Essential hypertension  Principal discharge diagnosis: Acute hypoxemic respiratory failure secondary to acute COPD exacerbation, and with pulmonary edema likely secondary to acute diastolic CHF exacerbation.  Discharge Instructions  Discharge Instructions    Diet - low sodium heart healthy   Complete by: As directed    Increase activity slowly   Complete by: As directed      Allergies as of 03/15/2019      Reactions   Shellfish Allergy Anaphylaxis   Penicillins Hives, Nausea And Vomiting   .Did it involve swelling of the face/tongue/throat, SOB, or low BP? Yes Did  it involve sudden or severe rash/hives, skin peeling, or any reaction on the inside of your mouth or nose? Yes Did you need to seek medical attention at a hospital or doctor's office? Yes When did it last happen? If all above answers are "NO", may proceed with cephalosporin use.      Medication List    TAKE these medications    aspirin 81 MG EC tablet Take 1 tablet (81 mg total) by mouth daily for 30 days. Start taking on: March 16, 2019   Combivent Respimat 20-100 MCG/ACT Aers respimat Generic drug: Ipratropium-Albuterol Inhale 1 puff into the lungs every 6 (six) hours as needed for wheezing or shortness of breath.   furosemide 20 MG tablet Commonly known as: Lasix Take 1 tablet (20 mg total) by mouth daily.   predniSONE 20 MG tablet Commonly known as: Deltasone Take 2 tablets (40 mg total) by mouth daily for 5 days.      Follow-up Information    pcp. Schedule an appointment as soon as possible for a visit in 1 week(s).          Allergies  Allergen Reactions  . Shellfish Allergy Anaphylaxis  . Penicillins Hives and Nausea And Vomiting    .Did it involve swelling of the face/tongue/throat, SOB, or low BP? Yes Did it involve sudden or severe rash/hives, skin peeling, or any reaction on the inside of your mouth or nose? Yes Did you need to seek medical attention at a hospital or doctor's office? Yes When did it last happen? If all above answers are "NO", may proceed with cephalosporin use.     Consultations:  None   Procedures/Studies: Dg Chest 2 View  Result Date: 03/12/2019 CLINICAL DATA:  Short of breath with leg swelling EXAM: CHEST - 2 VIEW COMPARISON:  12/22/2016 FINDINGS: Cardiomegaly with vascular congestion. Hazy and interstitial opacity greatest at the bases. No large effusion. Aortic atherosclerosis. No pneumothorax. IMPRESSION: Cardiomegaly with mild vascular congestion and hazy opacity at the bases suspect for mild edema. Electronically Signed   By: Donavan Foil M.D.   On: 03/12/2019 15:27     Discharge Exam: Vitals:   03/15/19 0700 03/15/19 0723  BP: 118/68   Pulse: 62   Resp: 18   Temp: (!) 97.5 F (36.4 C)   SpO2: 98% 99%   Vitals:   03/14/19 2047 03/14/19 2103 03/15/19 0700 03/15/19 0723  BP:  (!) 107/55 118/68   Pulse:  73 62   Resp:  20 18   Temp:  97.9  F (36.6 C) (!) 97.5 F (36.4 C)   TempSrc:  Oral Oral   SpO2: 90% 94% 98% 99%  Weight:   103.5 kg   Height:        General: Pt is alert, awake, not in acute distress Cardiovascular: RRR, S1/S2 +, no rubs, no gallops Respiratory: CTA bilaterally, no wheezing, no rhonchi Abdominal: Soft, NT, ND, bowel sounds + Extremities: Minimal bilateral edema, no cyanosis    The results of significant diagnostics from this hospitalization (including imaging, microbiology, ancillary and laboratory) are listed below for reference.     Microbiology: Recent Results (from the past 240 hour(s))  SARS Coronavirus 2 (CEPHEID- Performed in [place] hospital lab), Hosp Order     Status: None   Collection Time: 03/12/19  3:47 PM   Specimen: Nasopharyngeal Swab  Result Value Ref Range Status   SARS Coronavirus 2 NEGATIVE NEGATIVE Final    Comment: (NOTE) If result is NEGATIVE SARS-CoV-2 target nucleic  acids are NOT DETECTED. The SARS-CoV-2 RNA is generally detectable in upper and lower  respiratory specimens during the acute phase of infection. The lowest  concentration of SARS-CoV-2 viral copies this assay can detect is 250  copies / mL. A negative result does not preclude SARS-CoV-2 infection  and should not be used as the sole basis for treatment or other  patient management decisions.  A negative result may occur with  improper specimen collection / handling, submission of specimen other  than nasopharyngeal swab, presence of viral mutation(s) within the  areas targeted by this assay, and inadequate number of viral copies  (<250 copies / mL). A negative result must be combined with clinical  observations, patient history, and epidemiological information. If result is POSITIVE SARS-CoV-2 target nucleic acids are DETECTED. The SARS-CoV-2 RNA is generally detectable in upper and lower  respiratory specimens dur ing the acute phase of infection.  Positive  results are indicative of active  infection with SARS-CoV-2.  Clinical  correlation with patient history and other diagnostic information is  necessary to determine patient infection status.  Positive results do  not rule out bacterial infection or co-infection with other viruses. If result is PRESUMPTIVE POSTIVE SARS-CoV-2 nucleic acids MAY BE PRESENT.   A presumptive positive result was obtained on the submitted specimen  and confirmed on repeat testing.  While 2019 novel coronavirus  (SARS-CoV-2) nucleic acids may be present in the submitted sample  additional confirmatory testing may be necessary for epidemiological  and / or clinical management purposes  to differentiate between  SARS-CoV-2 and other Sarbecovirus currently known to infect humans.  If clinically indicated additional testing with an alternate test  methodology [phone]) is advised. The SARS-CoV-2 RNA is generally  detectable in upper and lower respiratory sp ecimens during the acute  phase of infection. The expected result is Negative. Fact Sheet for Patients:  StrictlyIdeas.no Fact Sheet for Healthcare Providers: BankingDealers.co.za This test is not yet approved or cleared by the [place] FDA and has been authorized for detection and/or diagnosis of SARS-CoV-2 by FDA under an Emergency Use Authorization (EUA).  This EUA will remain in effect (meaning this test can be used) for the duration of the COVID-19 declaration under Section 564(b)(1) of the Act, 21 U.S.C. section 360bbb-3(b)(1), unless the authorization is terminated or revoked sooner. Performed at [facility], [address], [place], [place] [address]      Labs: BNP (last 3 results) Recent Labs    03/12/19 1604  BNP 505.3*   Basic Metabolic Panel: Recent Labs  Lab 03/12/19 1704 03/13/19 0704 03/14/19 0502 03/15/19 0646  NA 141 138 139 141  K 4.2 4.1 4.5 4.6  CL 101 96* 94* 96*  CO2 32 33* 33* 37*  GLUCOSE 89 153* 165*  138*  BUN 17 20 30* 34*  CREATININE 0.98 1.04* 1.12* 1.06*  CALCIUM 8.5* 8.3* 8.8* 8.9  MG  --   --  2.3 2.5*   Liver Function Tests: No results for input(s): AST, ALT, ALKPHOS, BILITOT, PROT, ALBUMIN in the last 168 hours. No results for input(s): LIPASE, AMYLASE in the last 168 hours. No results for input(s): AMMONIA in the last 168 hours. CBC: Recent Labs  Lab 03/12/19 1604 03/14/19 0502 03/15/19 0646  WBC 9.2 12.1* 9.5  NEUTROABS 7.0  --   --   HGB 15.1* 15.1* 15.7*  HCT 49.0* 48.6* 51.1*  MCV 99.6 98.8 99.6  PLT 217 254 245   Cardiac Enzymes: Recent Labs  Lab  03/12/19 1704  TROPONINI 0.03*   BNP: Invalid input(s): POCBNP CBG: No results for input(s): GLUCAP in the last 168 hours. D-Dimer No results for input(s): DDIMER in the last 72 hours. Hgb A1c No results for input(s): HGBA1C in the last 72 hours. Lipid Profile No results for input(s): CHOL, HDL, LDLCALC, TRIG, CHOLHDL, LDLDIRECT in the last 72 hours. Thyroid function studies No results for input(s): TSH, T4TOTAL, T3FREE, THYROIDAB in the last 72 hours.  Invalid input(s): FREET3 Anemia work up No results for input(s): VITAMINB12, FOLATE, FERRITIN, TIBC, IRON, RETICCTPCT in the last 72 hours. Urinalysis    Component Value Date/Time   COLORURINE YELLOW 01/19/2012 2202   APPEARANCEUR CLEAR 01/19/2012 2202   LABSPEC 1.015 01/19/2012 2202   PHURINE 5.5 01/19/2012 2202   GLUCOSEU NEGATIVE 01/19/2012 2202   HGBUR NEGATIVE 01/19/2012 2202   BILIRUBINUR NEGATIVE 01/19/2012 2202   KETONESUR NEGATIVE 01/19/2012 2202   PROTEINUR NEGATIVE 01/19/2012 2202   UROBILINOGEN 0.2 01/19/2012 2202   NITRITE NEGATIVE 01/19/2012 2202   LEUKOCYTESUR NEGATIVE 01/19/2012 2202   Sepsis Labs Invalid input(s): PROCALCITONIN,  WBC,  LACTICIDVEN Microbiology Recent Results (from the past 240 hour(s))  SARS Coronavirus 2 (CEPHEID- Performed in [place] hospital lab), Hosp Order     Status: None   Collection Time: 03/12/19   3:47 PM   Specimen: Nasopharyngeal Swab  Result Value Ref Range Status   SARS Coronavirus 2 NEGATIVE NEGATIVE Final    Comment: (NOTE) If result is NEGATIVE SARS-CoV-2 target nucleic acids are NOT DETECTED. The SARS-CoV-2 RNA is generally detectable in upper and lower  respiratory specimens during the acute phase of infection. The lowest  concentration of SARS-CoV-2 viral copies this assay can detect is 250  copies / mL. A negative result does not preclude SARS-CoV-2 infection  and should not be used as the sole basis for treatment or other  patient management decisions.  A negative result may occur with  improper specimen collection / handling, submission of specimen other  than nasopharyngeal swab, presence of viral mutation(s) within the  areas targeted by this assay, and inadequate number of viral copies  (<250 copies / mL). A negative result must be combined with clinical  observations, patient history, and epidemiological information. If result is POSITIVE SARS-CoV-2 target nucleic acids are DETECTED. The SARS-CoV-2 RNA is generally detectable in upper and lower  respiratory specimens dur ing the acute phase of infection.  Positive  results are indicative of active infection with SARS-CoV-2.  Clinical  correlation with patient history and other diagnostic information is  necessary to determine patient infection status.  Positive results do  not rule out bacterial infection or co-infection with other viruses. If result is PRESUMPTIVE POSTIVE SARS-CoV-2 nucleic acids MAY BE PRESENT.   A presumptive positive result was obtained on the submitted specimen  and confirmed on repeat testing.  While 2019 novel coronavirus  (SARS-CoV-2) nucleic acids may be present in the submitted sample  additional confirmatory testing may be necessary for epidemiological  and / or clinical management purposes  to differentiate between  SARS-CoV-2 and other Sarbecovirus currently known to infect  humans.  If clinically indicated additional testing with an alternate test  methodology [phone]) is advised. The SARS-CoV-2 RNA is generally  detectable in upper and lower respiratory sp ecimens during the acute  phase of infection. The expected result is Negative. Fact Sheet for Patients:  StrictlyIdeas.no Fact Sheet for Healthcare Providers: BankingDealers.co.za This test is not yet approved or cleared by the [place] FDA  and has been authorized for detection and/or diagnosis of SARS-CoV-2 by FDA under an Emergency Use Authorization (EUA).  This EUA will remain in effect (meaning this test can be used) for the duration of the COVID-19 declaration under Section 564(b)(1) of the Act, 21 U.S.C. section 360bbb-3(b)(1), unless the authorization is terminated or revoked sooner. Performed at [facility], [address] [place], [place] [address]      Time coordinating discharge: 35 minutes  SIGNED:   Rodena Goldmann, DO [facility] 03/15/2019, 10:56 AM  If 7PM-7AM, please contact night-coverage [email or web address] Password [email or web address]

## 2019-03-24 DIAGNOSIS — H5703 Miosis: Secondary | ICD-10-CM | POA: Diagnosis not present

## 2019-03-24 DIAGNOSIS — H2513 Age-related nuclear cataract, bilateral: Secondary | ICD-10-CM | POA: Diagnosis not present

## 2019-04-04 DIAGNOSIS — H21562 Pupillary abnormality, left eye: Secondary | ICD-10-CM | POA: Diagnosis not present

## 2019-04-04 DIAGNOSIS — H2512 Age-related nuclear cataract, left eye: Secondary | ICD-10-CM | POA: Diagnosis not present

## 2019-04-12 DIAGNOSIS — I5040 Unspecified combined systolic (congestive) and diastolic (congestive) heart failure: Secondary | ICD-10-CM | POA: Diagnosis not present

## 2019-04-12 DIAGNOSIS — Z6839 Body mass index (BMI) 39.0-39.9, adult: Secondary | ICD-10-CM | POA: Diagnosis not present

## 2019-04-12 DIAGNOSIS — Z0189 Encounter for other specified special examinations: Secondary | ICD-10-CM | POA: Diagnosis not present

## 2019-04-12 DIAGNOSIS — E6609 Other obesity due to excess calories: Secondary | ICD-10-CM | POA: Diagnosis not present

## 2019-04-12 DIAGNOSIS — Z87891 Personal history of nicotine dependence: Secondary | ICD-10-CM | POA: Diagnosis not present

## 2019-04-12 DIAGNOSIS — R252 Cramp and spasm: Secondary | ICD-10-CM | POA: Diagnosis not present

## 2019-04-14 DIAGNOSIS — H2511 Age-related nuclear cataract, right eye: Secondary | ICD-10-CM | POA: Diagnosis not present

## 2019-04-14 DIAGNOSIS — Z961 Presence of intraocular lens: Secondary | ICD-10-CM | POA: Diagnosis not present

## 2019-04-14 DIAGNOSIS — H5703 Miosis: Secondary | ICD-10-CM | POA: Diagnosis not present

## 2019-04-18 DIAGNOSIS — H5703 Miosis: Secondary | ICD-10-CM | POA: Diagnosis not present

## 2019-04-18 DIAGNOSIS — H2511 Age-related nuclear cataract, right eye: Secondary | ICD-10-CM | POA: Diagnosis not present

## 2019-04-18 DIAGNOSIS — Z961 Presence of intraocular lens: Secondary | ICD-10-CM | POA: Diagnosis not present

## 2019-04-22 ENCOUNTER — Other Ambulatory Visit: Payer: Self-pay

## 2019-04-26 DIAGNOSIS — Z6841 Body Mass Index (BMI) 40.0 and over, adult: Secondary | ICD-10-CM | POA: Diagnosis not present

## 2019-04-26 DIAGNOSIS — R252 Cramp and spasm: Secondary | ICD-10-CM | POA: Diagnosis not present

## 2019-04-26 DIAGNOSIS — D45 Polycythemia vera: Secondary | ICD-10-CM | POA: Diagnosis not present

## 2019-04-26 DIAGNOSIS — I5040 Unspecified combined systolic (congestive) and diastolic (congestive) heart failure: Secondary | ICD-10-CM | POA: Diagnosis not present

## 2019-04-26 DIAGNOSIS — R944 Abnormal results of kidney function studies: Secondary | ICD-10-CM | POA: Diagnosis not present

## 2019-04-26 DIAGNOSIS — Z87891 Personal history of nicotine dependence: Secondary | ICD-10-CM | POA: Diagnosis not present

## 2019-04-26 DIAGNOSIS — R7301 Impaired fasting glucose: Secondary | ICD-10-CM | POA: Diagnosis not present

## 2019-04-26 DIAGNOSIS — Z0001 Encounter for general adult medical examination with abnormal findings: Secondary | ICD-10-CM | POA: Diagnosis not present

## 2019-04-26 DIAGNOSIS — E782 Mixed hyperlipidemia: Secondary | ICD-10-CM | POA: Diagnosis not present

## 2019-06-15 DIAGNOSIS — L209 Atopic dermatitis, unspecified: Secondary | ICD-10-CM | POA: Diagnosis not present

## 2019-06-15 DIAGNOSIS — R21 Rash and other nonspecific skin eruption: Secondary | ICD-10-CM | POA: Diagnosis not present

## 2019-06-17 ENCOUNTER — Other Ambulatory Visit: Payer: Self-pay

## 2019-06-17 DIAGNOSIS — R6889 Other general symptoms and signs: Secondary | ICD-10-CM | POA: Diagnosis not present

## 2019-06-17 DIAGNOSIS — Z20822 Contact with and (suspected) exposure to covid-19: Secondary | ICD-10-CM

## 2019-06-18 LAB — NOVEL CORONAVIRUS, NAA: SARS-CoV-2, NAA: NOT DETECTED

## 2019-07-29 DIAGNOSIS — R7301 Impaired fasting glucose: Secondary | ICD-10-CM | POA: Diagnosis not present

## 2019-07-29 DIAGNOSIS — E782 Mixed hyperlipidemia: Secondary | ICD-10-CM | POA: Diagnosis not present

## 2019-08-24 DIAGNOSIS — D45 Polycythemia vera: Secondary | ICD-10-CM | POA: Diagnosis not present

## 2019-08-24 DIAGNOSIS — E782 Mixed hyperlipidemia: Secondary | ICD-10-CM | POA: Diagnosis not present

## 2019-08-24 DIAGNOSIS — I5032 Chronic diastolic (congestive) heart failure: Secondary | ICD-10-CM | POA: Diagnosis not present

## 2019-08-24 DIAGNOSIS — R7301 Impaired fasting glucose: Secondary | ICD-10-CM | POA: Diagnosis not present

## 2019-08-24 DIAGNOSIS — J449 Chronic obstructive pulmonary disease, unspecified: Secondary | ICD-10-CM | POA: Diagnosis not present

## 2019-08-24 DIAGNOSIS — F17218 Nicotine dependence, cigarettes, with other nicotine-induced disorders: Secondary | ICD-10-CM | POA: Diagnosis not present

## 2019-08-24 DIAGNOSIS — R7303 Prediabetes: Secondary | ICD-10-CM | POA: Diagnosis not present

## 2019-08-24 DIAGNOSIS — R944 Abnormal results of kidney function studies: Secondary | ICD-10-CM | POA: Diagnosis not present

## 2019-08-24 DIAGNOSIS — F17228 Nicotine dependence, chewing tobacco, with other nicotine-induced disorders: Secondary | ICD-10-CM | POA: Diagnosis not present

## 2019-08-24 DIAGNOSIS — B354 Tinea corporis: Secondary | ICD-10-CM | POA: Diagnosis not present

## 2019-08-25 ENCOUNTER — Other Ambulatory Visit (HOSPITAL_COMMUNITY): Payer: Self-pay | Admitting: Internal Medicine

## 2019-08-25 DIAGNOSIS — Z1231 Encounter for screening mammogram for malignant neoplasm of breast: Secondary | ICD-10-CM

## 2019-11-12 DIAGNOSIS — L309 Dermatitis, unspecified: Secondary | ICD-10-CM | POA: Diagnosis not present

## 2019-11-12 DIAGNOSIS — B379 Candidiasis, unspecified: Secondary | ICD-10-CM | POA: Diagnosis not present

## 2019-11-12 DIAGNOSIS — A499 Bacterial infection, unspecified: Secondary | ICD-10-CM | POA: Diagnosis not present

## 2019-12-15 ENCOUNTER — Ambulatory Visit: Payer: PPO | Attending: Internal Medicine

## 2019-12-15 DIAGNOSIS — Z23 Encounter for immunization: Secondary | ICD-10-CM

## 2019-12-15 NOTE — Progress Notes (Signed)
   Covid-19 Vaccination Clinic  Name:  Brittany Bass    MRN: [id number] DOB: 09-09-1951  12/15/2019  Brittany Bass was observed post Covid-19 immunization for 30 minutes based on pre-vaccination screening without incident. She was provided with Vaccine Information Sheet and instruction to access the V-Safe system.   Brittany Bass was instructed to call 911 with any severe reactions post vaccine: Marland Kitchen Difficulty breathing  . Swelling of face and throat  . A fast heartbeat  . A bad rash all over body  . Dizziness and weakness   Immunizations Administered    Name Date Dose VIS Date Route   Moderna COVID-19 Vaccine 12/15/2019  9:09 AM 0.5 mL 08/23/2019 Intramuscular   Manufacturer: Moderna   Lot: [id number]   [place][id number]

## 2019-12-22 DIAGNOSIS — F17228 Nicotine dependence, chewing tobacco, with other nicotine-induced disorders: Secondary | ICD-10-CM | POA: Diagnosis not present

## 2019-12-22 DIAGNOSIS — E6609 Other obesity due to excess calories: Secondary | ICD-10-CM | POA: Diagnosis not present

## 2019-12-22 DIAGNOSIS — B379 Candidiasis, unspecified: Secondary | ICD-10-CM | POA: Diagnosis not present

## 2019-12-22 DIAGNOSIS — F17218 Nicotine dependence, cigarettes, with other nicotine-induced disorders: Secondary | ICD-10-CM | POA: Diagnosis not present

## 2019-12-22 DIAGNOSIS — I5032 Chronic diastolic (congestive) heart failure: Secondary | ICD-10-CM | POA: Diagnosis not present

## 2019-12-22 DIAGNOSIS — A499 Bacterial infection, unspecified: Secondary | ICD-10-CM | POA: Diagnosis not present

## 2019-12-22 DIAGNOSIS — D45 Polycythemia vera: Secondary | ICD-10-CM | POA: Diagnosis not present

## 2019-12-22 DIAGNOSIS — E669 Obesity, unspecified: Secondary | ICD-10-CM | POA: Diagnosis not present

## 2019-12-22 DIAGNOSIS — E782 Mixed hyperlipidemia: Secondary | ICD-10-CM | POA: Diagnosis not present

## 2019-12-22 DIAGNOSIS — I5040 Unspecified combined systolic (congestive) and diastolic (congestive) heart failure: Secondary | ICD-10-CM | POA: Diagnosis not present

## 2019-12-22 DIAGNOSIS — B354 Tinea corporis: Secondary | ICD-10-CM | POA: Diagnosis not present

## 2019-12-28 DIAGNOSIS — D45 Polycythemia vera: Secondary | ICD-10-CM | POA: Diagnosis not present

## 2019-12-28 DIAGNOSIS — N182 Chronic kidney disease, stage 2 (mild): Secondary | ICD-10-CM | POA: Diagnosis not present

## 2019-12-28 DIAGNOSIS — L409 Psoriasis, unspecified: Secondary | ICD-10-CM | POA: Diagnosis not present

## 2019-12-28 DIAGNOSIS — F17218 Nicotine dependence, cigarettes, with other nicotine-induced disorders: Secondary | ICD-10-CM | POA: Diagnosis not present

## 2019-12-28 DIAGNOSIS — B354 Tinea corporis: Secondary | ICD-10-CM | POA: Diagnosis not present

## 2019-12-28 DIAGNOSIS — F411 Generalized anxiety disorder: Secondary | ICD-10-CM | POA: Diagnosis not present

## 2019-12-28 DIAGNOSIS — Z0001 Encounter for general adult medical examination with abnormal findings: Secondary | ICD-10-CM | POA: Diagnosis not present

## 2019-12-28 DIAGNOSIS — E782 Mixed hyperlipidemia: Secondary | ICD-10-CM | POA: Diagnosis not present

## 2019-12-28 DIAGNOSIS — B379 Candidiasis, unspecified: Secondary | ICD-10-CM | POA: Diagnosis not present

## 2019-12-28 DIAGNOSIS — E1169 Type 2 diabetes mellitus with other specified complication: Secondary | ICD-10-CM | POA: Diagnosis not present

## 2019-12-28 DIAGNOSIS — E6609 Other obesity due to excess calories: Secondary | ICD-10-CM | POA: Diagnosis not present

## 2019-12-28 DIAGNOSIS — I5032 Chronic diastolic (congestive) heart failure: Secondary | ICD-10-CM | POA: Diagnosis not present

## 2019-12-28 DIAGNOSIS — F17228 Nicotine dependence, chewing tobacco, with other nicotine-induced disorders: Secondary | ICD-10-CM | POA: Diagnosis not present

## 2019-12-28 DIAGNOSIS — J449 Chronic obstructive pulmonary disease, unspecified: Secondary | ICD-10-CM | POA: Diagnosis not present

## 2019-12-28 DIAGNOSIS — E669 Obesity, unspecified: Secondary | ICD-10-CM | POA: Diagnosis not present

## 2019-12-28 DIAGNOSIS — A499 Bacterial infection, unspecified: Secondary | ICD-10-CM | POA: Diagnosis not present

## 2020-01-03 DIAGNOSIS — J019 Acute sinusitis, unspecified: Secondary | ICD-10-CM | POA: Diagnosis not present

## 2020-01-04 DIAGNOSIS — K625 Hemorrhage of anus and rectum: Secondary | ICD-10-CM | POA: Diagnosis not present

## 2020-01-17 ENCOUNTER — Ambulatory Visit: Payer: PPO

## 2020-01-18 ENCOUNTER — Ambulatory Visit: Payer: PPO

## 2020-01-24 ENCOUNTER — Ambulatory Visit: Payer: PPO | Attending: Internal Medicine

## 2020-01-24 DIAGNOSIS — Z23 Encounter for immunization: Secondary | ICD-10-CM

## 2020-01-24 NOTE — Progress Notes (Signed)
   Covid-19 Vaccination Clinic  Name:  Brittany Bass    MRN: [id number] DOB: 1950-11-15  01/24/2020  [name] was observed post Covid-19 immunization for 15 minutes without incident. She was provided with Vaccine Information Sheet and instruction to access the V-Safe system.   [name] was instructed to call 911 with any severe reactions post vaccine: Marland Kitchen Difficulty breathing  . Swelling of face and throat  . A fast heartbeat  . A bad rash all over body  . Dizziness and weakness   Immunizations Administered    Name Date Dose VIS Date Route   Moderna COVID-19 Vaccine 01/24/2020 11:41 AM 0.5 mL 08/2019 Intramuscular   Manufacturer: Moderna   Lot: [id number]   [place]:[id number]

## 2020-01-30 DIAGNOSIS — M5432 Sciatica, left side: Secondary | ICD-10-CM | POA: Diagnosis not present

## 2020-01-30 DIAGNOSIS — J449 Chronic obstructive pulmonary disease, unspecified: Secondary | ICD-10-CM | POA: Diagnosis not present

## 2020-01-30 DIAGNOSIS — F411 Generalized anxiety disorder: Secondary | ICD-10-CM | POA: Diagnosis not present

## 2020-01-30 DIAGNOSIS — J019 Acute sinusitis, unspecified: Secondary | ICD-10-CM | POA: Diagnosis not present

## 2020-05-02 DIAGNOSIS — E782 Mixed hyperlipidemia: Secondary | ICD-10-CM | POA: Diagnosis not present

## 2020-05-02 DIAGNOSIS — R7301 Impaired fasting glucose: Secondary | ICD-10-CM | POA: Diagnosis not present

## 2020-05-02 DIAGNOSIS — R7303 Prediabetes: Secondary | ICD-10-CM | POA: Diagnosis not present

## 2020-05-02 DIAGNOSIS — R944 Abnormal results of kidney function studies: Secondary | ICD-10-CM | POA: Diagnosis not present

## 2020-05-02 DIAGNOSIS — E1169 Type 2 diabetes mellitus with other specified complication: Secondary | ICD-10-CM | POA: Diagnosis not present

## 2020-05-02 DIAGNOSIS — Z1321 Encounter for screening for nutritional disorder: Secondary | ICD-10-CM | POA: Diagnosis not present

## 2020-05-09 DIAGNOSIS — L409 Psoriasis, unspecified: Secondary | ICD-10-CM | POA: Diagnosis not present

## 2020-05-09 DIAGNOSIS — J449 Chronic obstructive pulmonary disease, unspecified: Secondary | ICD-10-CM | POA: Diagnosis not present

## 2020-05-09 DIAGNOSIS — D45 Polycythemia vera: Secondary | ICD-10-CM | POA: Diagnosis not present

## 2020-05-09 DIAGNOSIS — F411 Generalized anxiety disorder: Secondary | ICD-10-CM | POA: Diagnosis not present

## 2020-05-09 DIAGNOSIS — N1831 Chronic kidney disease, stage 3a: Secondary | ICD-10-CM | POA: Diagnosis not present

## 2020-05-09 DIAGNOSIS — F17218 Nicotine dependence, cigarettes, with other nicotine-induced disorders: Secondary | ICD-10-CM | POA: Diagnosis not present

## 2020-05-09 DIAGNOSIS — I5032 Chronic diastolic (congestive) heart failure: Secondary | ICD-10-CM | POA: Diagnosis not present

## 2020-05-09 DIAGNOSIS — E782 Mixed hyperlipidemia: Secondary | ICD-10-CM | POA: Diagnosis not present

## 2020-05-09 DIAGNOSIS — E1122 Type 2 diabetes mellitus with diabetic chronic kidney disease: Secondary | ICD-10-CM | POA: Diagnosis not present

## 2020-07-23 IMAGING — DX CHEST - 2 VIEW
2 series · 2 of 2 positions shown · non-contrast
Comparison: 12/22/2016

CLINICAL DATA: Short of breath with leg swelling

EXAM:
CHEST - 2 VIEW

[chest pa]
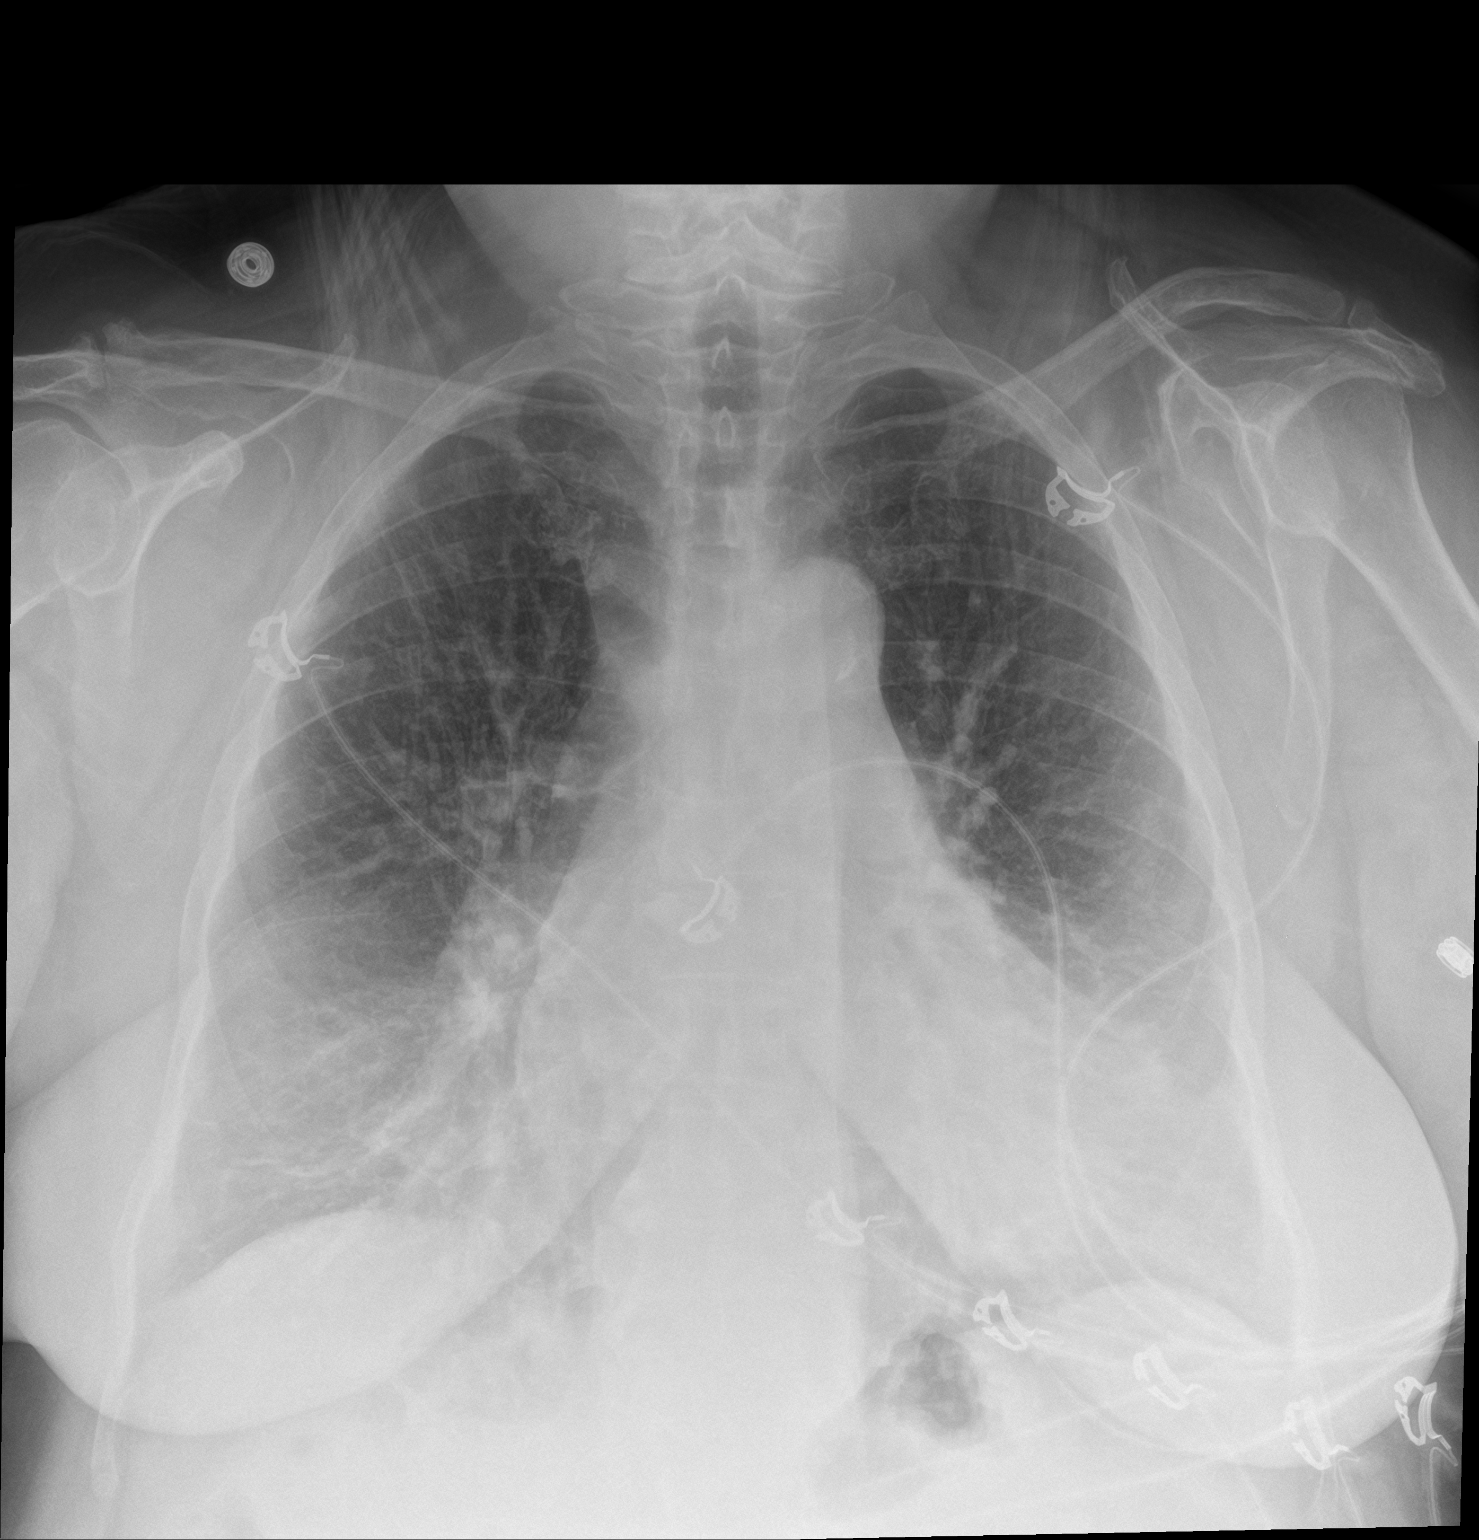

[chest lat]
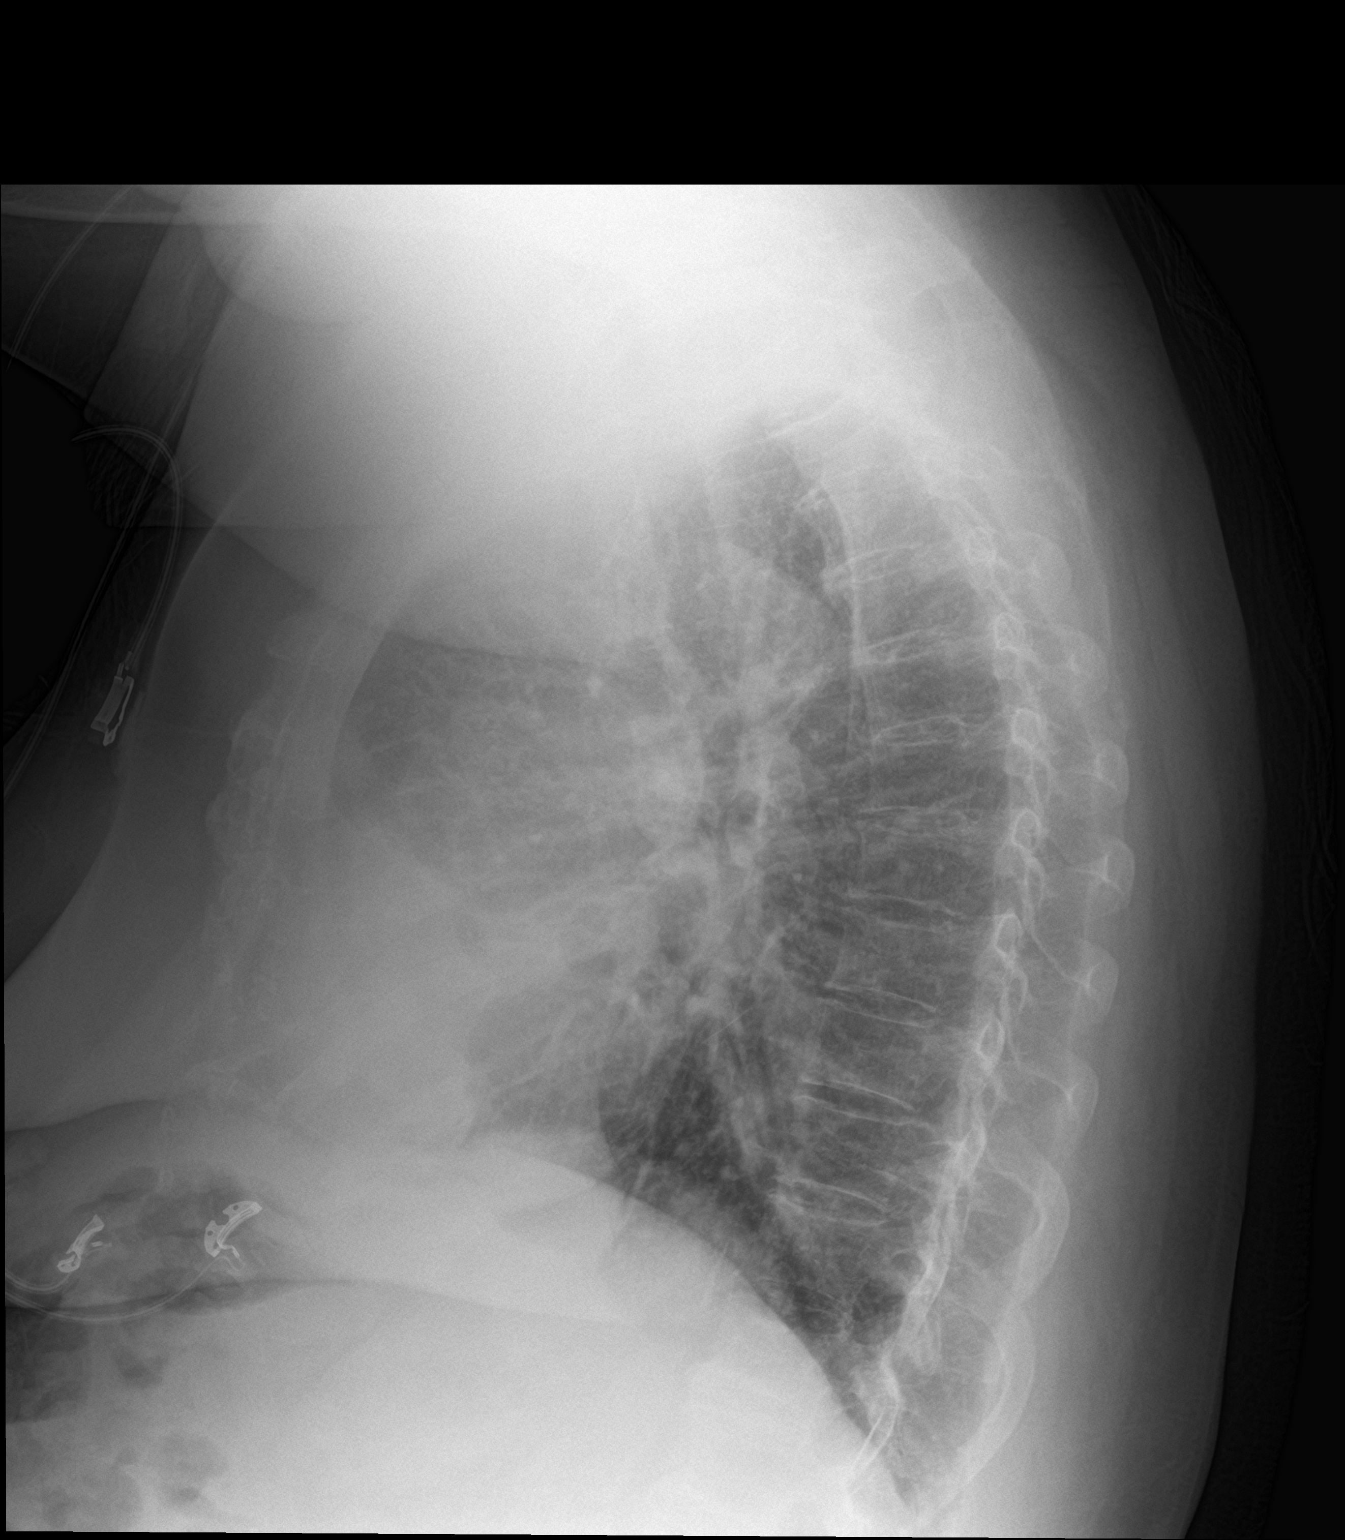

[2 of 2 positions shown; findings below may reference images not displayed]

FINDINGS: Cardiomegaly with vascular congestion. Hazy and interstitial opacity
greatest at the bases. No large effusion. Aortic atherosclerosis. No
pneumothorax.
IMPRESSION: Cardiomegaly with mild vascular congestion and hazy opacity at the
bases suspect for mild edema.

## 2020-11-15 DIAGNOSIS — F411 Generalized anxiety disorder: Secondary | ICD-10-CM | POA: Diagnosis not present

## 2020-11-15 DIAGNOSIS — B354 Tinea corporis: Secondary | ICD-10-CM | POA: Diagnosis not present

## 2020-11-15 DIAGNOSIS — E782 Mixed hyperlipidemia: Secondary | ICD-10-CM | POA: Diagnosis not present

## 2020-11-15 DIAGNOSIS — J449 Chronic obstructive pulmonary disease, unspecified: Secondary | ICD-10-CM | POA: Diagnosis not present

## 2021-01-07 DIAGNOSIS — Z0000 Encounter for general adult medical examination without abnormal findings: Secondary | ICD-10-CM | POA: Diagnosis not present

## 2021-01-07 DIAGNOSIS — E782 Mixed hyperlipidemia: Secondary | ICD-10-CM | POA: Diagnosis not present

## 2021-01-07 DIAGNOSIS — N1831 Chronic kidney disease, stage 3a: Secondary | ICD-10-CM | POA: Diagnosis not present

## 2021-01-07 DIAGNOSIS — Z79899 Other long term (current) drug therapy: Secondary | ICD-10-CM | POA: Diagnosis not present

## 2021-01-07 DIAGNOSIS — I5032 Chronic diastolic (congestive) heart failure: Secondary | ICD-10-CM | POA: Diagnosis not present

## 2021-01-07 DIAGNOSIS — E1169 Type 2 diabetes mellitus with other specified complication: Secondary | ICD-10-CM | POA: Diagnosis not present

## 2021-01-07 DIAGNOSIS — E1122 Type 2 diabetes mellitus with diabetic chronic kidney disease: Secondary | ICD-10-CM | POA: Diagnosis not present

## 2021-01-07 DIAGNOSIS — R7301 Impaired fasting glucose: Secondary | ICD-10-CM | POA: Diagnosis not present

## 2021-01-07 DIAGNOSIS — R944 Abnormal results of kidney function studies: Secondary | ICD-10-CM | POA: Diagnosis not present

## 2021-01-07 DIAGNOSIS — I5040 Unspecified combined systolic (congestive) and diastolic (congestive) heart failure: Secondary | ICD-10-CM | POA: Diagnosis not present

## 2021-01-11 ENCOUNTER — Other Ambulatory Visit (HOSPITAL_COMMUNITY): Payer: Self-pay | Admitting: Internal Medicine

## 2021-01-11 ENCOUNTER — Other Ambulatory Visit: Payer: Self-pay | Admitting: Family Medicine

## 2021-01-11 DIAGNOSIS — M545 Low back pain, unspecified: Secondary | ICD-10-CM

## 2021-01-11 DIAGNOSIS — F17218 Nicotine dependence, cigarettes, with other nicotine-induced disorders: Secondary | ICD-10-CM | POA: Diagnosis not present

## 2021-01-11 DIAGNOSIS — Z0001 Encounter for general adult medical examination with abnormal findings: Secondary | ICD-10-CM | POA: Diagnosis not present

## 2021-01-11 DIAGNOSIS — L409 Psoriasis, unspecified: Secondary | ICD-10-CM | POA: Diagnosis not present

## 2021-01-11 DIAGNOSIS — J449 Chronic obstructive pulmonary disease, unspecified: Secondary | ICD-10-CM | POA: Diagnosis not present

## 2021-01-11 DIAGNOSIS — D45 Polycythemia vera: Secondary | ICD-10-CM | POA: Diagnosis not present

## 2021-01-11 DIAGNOSIS — E875 Hyperkalemia: Secondary | ICD-10-CM | POA: Diagnosis not present

## 2021-01-11 DIAGNOSIS — Z1231 Encounter for screening mammogram for malignant neoplasm of breast: Secondary | ICD-10-CM

## 2021-01-11 DIAGNOSIS — E559 Vitamin D deficiency, unspecified: Secondary | ICD-10-CM | POA: Diagnosis not present

## 2021-01-11 DIAGNOSIS — E782 Mixed hyperlipidemia: Secondary | ICD-10-CM | POA: Diagnosis not present

## 2021-01-11 DIAGNOSIS — Z13820 Encounter for screening for osteoporosis: Secondary | ICD-10-CM

## 2021-01-11 DIAGNOSIS — I5032 Chronic diastolic (congestive) heart failure: Secondary | ICD-10-CM | POA: Diagnosis not present

## 2021-01-11 DIAGNOSIS — F411 Generalized anxiety disorder: Secondary | ICD-10-CM | POA: Diagnosis not present

## 2021-01-11 DIAGNOSIS — E1122 Type 2 diabetes mellitus with diabetic chronic kidney disease: Secondary | ICD-10-CM | POA: Diagnosis not present

## 2021-01-11 DIAGNOSIS — N1831 Chronic kidney disease, stage 3a: Secondary | ICD-10-CM | POA: Diagnosis not present

## 2021-01-15 ENCOUNTER — Ambulatory Visit (HOSPITAL_COMMUNITY)
Admission: RE | Admit: 2021-01-15 | Discharge: 2021-01-15 | Disposition: A | Discharge status: Home / Self Care | Payer: PPO | Source: Ambulatory Visit | Attending: Family Medicine | Admitting: Family Medicine

## 2021-01-15 ENCOUNTER — Other Ambulatory Visit: Payer: Self-pay

## 2021-01-15 DIAGNOSIS — M545 Low back pain, unspecified: Secondary | ICD-10-CM | POA: Insufficient documentation

## 2021-01-17 ENCOUNTER — Ambulatory Visit (HOSPITAL_COMMUNITY)
Admission: RE | Admit: 2021-01-17 | Discharge: 2021-01-17 | Disposition: A | Discharge status: Home / Self Care | Payer: PPO | Source: Ambulatory Visit | Attending: Internal Medicine | Admitting: Internal Medicine

## 2021-01-17 ENCOUNTER — Encounter (HOSPITAL_COMMUNITY): Payer: Self-pay

## 2021-01-17 DIAGNOSIS — Z13820 Encounter for screening for osteoporosis: Secondary | ICD-10-CM | POA: Diagnosis not present

## 2021-01-17 DIAGNOSIS — Z1231 Encounter for screening mammogram for malignant neoplasm of breast: Secondary | ICD-10-CM

## 2021-01-17 DIAGNOSIS — M810 Age-related osteoporosis without current pathological fracture: Secondary | ICD-10-CM | POA: Insufficient documentation

## 2021-01-17 DIAGNOSIS — Z780 Asymptomatic menopausal state: Secondary | ICD-10-CM | POA: Insufficient documentation

## 2021-01-24 DIAGNOSIS — Z1212 Encounter for screening for malignant neoplasm of rectum: Secondary | ICD-10-CM | POA: Diagnosis not present

## 2021-01-24 DIAGNOSIS — Z1211 Encounter for screening for malignant neoplasm of colon: Secondary | ICD-10-CM | POA: Diagnosis not present

## 2021-01-31 ENCOUNTER — Emergency Department (HOSPITAL_COMMUNITY)
Admission: EM | Admit: 2021-01-31 | Discharge: 2021-01-31 | Disposition: A | Discharge status: Home / Self Care | Payer: PPO | Attending: Emergency Medicine | Admitting: Emergency Medicine

## 2021-01-31 ENCOUNTER — Emergency Department (HOSPITAL_COMMUNITY): Payer: PPO

## 2021-01-31 ENCOUNTER — Encounter (HOSPITAL_COMMUNITY): Payer: Self-pay

## 2021-01-31 ENCOUNTER — Other Ambulatory Visit: Payer: Self-pay

## 2021-01-31 DIAGNOSIS — M545 Low back pain, unspecified: Secondary | ICD-10-CM | POA: Insufficient documentation

## 2021-01-31 DIAGNOSIS — I509 Heart failure, unspecified: Secondary | ICD-10-CM | POA: Diagnosis not present

## 2021-01-31 DIAGNOSIS — I708 Atherosclerosis of other arteries: Secondary | ICD-10-CM | POA: Diagnosis not present

## 2021-01-31 DIAGNOSIS — R109 Unspecified abdominal pain: Secondary | ICD-10-CM | POA: Diagnosis not present

## 2021-01-31 DIAGNOSIS — K8020 Calculus of gallbladder without cholecystitis without obstruction: Secondary | ICD-10-CM | POA: Diagnosis not present

## 2021-01-31 DIAGNOSIS — I110 Hypertensive heart disease with heart failure: Secondary | ICD-10-CM | POA: Diagnosis not present

## 2021-01-31 DIAGNOSIS — J441 Chronic obstructive pulmonary disease with (acute) exacerbation: Secondary | ICD-10-CM | POA: Diagnosis not present

## 2021-01-31 DIAGNOSIS — M48061 Spinal stenosis, lumbar region without neurogenic claudication: Secondary | ICD-10-CM | POA: Diagnosis not present

## 2021-01-31 DIAGNOSIS — N200 Calculus of kidney: Secondary | ICD-10-CM | POA: Diagnosis not present

## 2021-01-31 DIAGNOSIS — F17210 Nicotine dependence, cigarettes, uncomplicated: Secondary | ICD-10-CM | POA: Diagnosis not present

## 2021-01-31 HISTORY — DX: Age-related osteoporosis without current pathological fracture: M81.0

## 2021-01-31 HISTORY — DX: Chronic obstructive pulmonary disease, unspecified: J44.9

## 2021-01-31 HISTORY — DX: Calculus of kidney: N20.0

## 2021-01-31 LAB — CBC WITH DIFFERENTIAL/PLATELET
Abs Immature Granulocytes: 0.02 10*3/uL (ref 0.00–0.07)
Basophils Absolute: 0 10*3/uL (ref 0.0–0.1)
Basophils Relative: 1 %
Eosinophils Absolute: 0.1 10*3/uL (ref 0.0–0.5)
Eosinophils Relative: 1 %
HCT: 50.2 % — ABNORMAL HIGH (ref 36.0–46.0)
Hemoglobin: 16.2 g/dL — ABNORMAL HIGH (ref 12.0–15.0)
Immature Granulocytes: 0 %
Lymphocytes Relative: 16 %
Lymphs Abs: 1 10*3/uL (ref 0.7–4.0)
MCH: 31.2 pg (ref 26.0–34.0)
MCHC: 32.3 g/dL (ref 30.0–36.0)
MCV: 96.7 fL (ref 80.0–100.0)
Monocytes Absolute: 0.4 10*3/uL (ref 0.1–1.0)
Monocytes Relative: 7 %
Neutro Abs: 4.6 10*3/uL (ref 1.7–7.7)
Neutrophils Relative %: 75 %
Platelets: 208 10*3/uL (ref 150–400)
RBC: 5.19 MIL/uL — ABNORMAL HIGH (ref 3.87–5.11)
RDW: 13.3 % (ref 11.5–15.5)
WBC: 6.2 10*3/uL (ref 4.0–10.5)
nRBC: 0 % (ref 0.0–0.2)

## 2021-01-31 LAB — URINALYSIS, ROUTINE W REFLEX MICROSCOPIC
Bilirubin Urine: NEGATIVE
Glucose, UA: NEGATIVE mg/dL
Hgb urine dipstick: NEGATIVE
Ketones, ur: NEGATIVE mg/dL
Leukocytes,Ua: NEGATIVE
Nitrite: NEGATIVE
Protein, ur: NEGATIVE mg/dL
Specific Gravity, Urine: 1.019 (ref 1.005–1.030)
pH: 5 (ref 5.0–8.0)

## 2021-01-31 LAB — COMPREHENSIVE METABOLIC PANEL
ALT: 19 U/L (ref 0–44)
AST: 16 U/L (ref 15–41)
Albumin: 3.5 g/dL (ref 3.5–5.0)
Alkaline Phosphatase: 83 U/L (ref 38–126)
Anion gap: 7 (ref 5–15)
BUN: 13 mg/dL (ref 8–23)
CO2: 29 mmol/L (ref 22–32)
Calcium: 8.6 mg/dL — ABNORMAL LOW (ref 8.9–10.3)
Chloride: 101 mmol/L (ref 98–111)
Creatinine, Ser: 0.89 mg/dL (ref 0.44–1.00)
GFR, Estimated: >60 mL/min (ref >60)
Glucose, Bld: 115 mg/dL — ABNORMAL HIGH (ref 70–99)
Potassium: 4.2 mmol/L (ref 3.5–5.1)
Sodium: 137 mmol/L (ref 135–145)
Total Bilirubin: 0.7 mg/dL (ref 0.3–1.2)
Total Protein: 6.7 g/dL (ref 6.5–8.1)

## 2021-01-31 LAB — EXTERNAL GENERIC LAB PROCEDURE: COLOGUARD: NEGATIVE

## 2021-01-31 LAB — COLOGUARD: COLOGUARD: NEGATIVE

## 2021-01-31 MED ORDER — HYDROMORPHONE HCL 1 MG/ML IJ SOLN
1.0000 mg | Freq: Once | INTRAMUSCULAR | Status: AC
  Administered 2021-01-31: 1 mg via INTRAVENOUS
  Filled 2021-01-31: qty 1

## 2021-01-31 MED ORDER — OXYCODONE-ACETAMINOPHEN 5-325 MG PO TABS: 1.0000 | ORAL_TABLET | Freq: Four times a day (QID) | ORAL | 0 refills | Status: AC | PRN

## 2021-01-31 MED ORDER — ONDANSETRON HCL 4 MG/2ML IJ SOLN
4.0000 mg | Freq: Once | INTRAMUSCULAR | Status: AC
  Administered 2021-01-31: 4 mg via INTRAVENOUS
  Filled 2021-01-31: qty 2

## 2021-01-31 NOTE — ED Provider Notes (Signed)
[facility] EMERGENCY DEPARTMENT Provider Note   CSN: [id number] Arrival date & time: 01/31/21  5188     History Chief Complaint  Patient presents with  . Flank Pain    Brittany Bass is a 70 y.o. female.  Patient with right flank pain and lower back pain.  Worse radiating down right leg.  The history is provided by the patient and medical records. No language interpreter was used.  Flank Pain This is a new problem. The problem occurs constantly. The problem has not changed since onset.Pertinent negatives include no chest pain, no abdominal pain and no headaches. Nothing aggravates the symptoms. Nothing relieves the symptoms. She has tried nothing for the symptoms.       Past Medical History:  Diagnosis Date  . COPD (chronic obstructive pulmonary disease) ([place])   . Kidney stone   . Osteoporosis     Patient Active Problem List   Diagnosis Date Noted  . Acute respiratory failure with hypoxia ([place]) 03/12/2019  . COPD with acute exacerbation ([place]) 03/12/2019  . Acute CHF (congestive heart failure) ([place]) 03/12/2019  . Obesity 03/12/2019  . Essential hypertension 03/12/2019    Past Surgical History:  Procedure Laterality Date  . CESAREAN SECTION    . jaundice       OB History   No obstetric history on file.     Family History  Problem Relation Age of Onset  . Breast cancer Sister     Social History   Tobacco Use  . Smoking status: Current Every Day Smoker    Packs/day: 1.00    Types: Cigarettes  . Smokeless tobacco: Never Used  Vaping Use  . Vaping Use: Former  Substance Use Topics  . Alcohol use: No  . Drug use: No    Home Medications Prior to Admission medications   Medication Sig Start Date End Date Taking? Authorizing Provider  furosemide (LASIX) 20 MG tablet Take 1 tablet (20 mg total) by mouth daily. 03/15/19 03/14/20 Yes Shah, Pratik D, DO  oxyCODONE-acetaminophen (PERCOCET) 5-325 MG tablet Take 1 tablet by mouth every 6 (six) hours as needed.  01/31/21  Yes Milton Ferguson, MD  VITAMIN D PO Take 1 tablet by mouth daily.   Yes [provider]  Ipratropium-Albuterol (COMBIVENT RESPIMAT) 20-100 MCG/ACT AERS respimat Inhale 1 puff into the lungs every 6 (six) hours as needed for wheezing or shortness of breath. 03/15/19   Manuella Ghazi, Pratik D, DO    Allergies    Shellfish allergy and Penicillins  Review of Systems   Review of Systems  Constitutional: Negative for appetite change and fatigue.  HENT: Negative for congestion, ear discharge and sinus pressure.   Eyes: Negative for discharge.  Respiratory: Negative for cough.   Cardiovascular: Negative for chest pain.  Gastrointestinal: Negative for abdominal pain and diarrhea.  Genitourinary: Positive for flank pain. Negative for frequency and hematuria.  Musculoskeletal: Negative for back pain.  Skin: Negative for rash.  Neurological: Negative for seizures and headaches.  Psychiatric/Behavioral: Negative for hallucinations.    Physical Exam Updated Vital Signs BP (!) 107/53 (BP Location: Right Arm)   Pulse 61   Temp 97.7 F (36.5 C) (Oral)   Resp 20   Ht 5\' 2"  (1.575 m)   Wt 100.2 kg   SpO2 99%   BMI 40.42 kg/m   Physical Exam Vitals reviewed.  Constitutional:      Appearance: She is well-developed.  HENT:     Head: Normocephalic.     Nose: Nose  normal.  Eyes:     General: No scleral icterus.    Conjunctiva/sclera: Conjunctivae normal.  Neck:     Thyroid: No thyromegaly.  Cardiovascular:     Rate and Rhythm: Normal rate and regular rhythm.     Heart sounds: No murmur heard. No friction rub. No gallop.   Pulmonary:     Breath sounds: No stridor. No wheezing or rales.  Chest:     Chest wall: No tenderness.  Abdominal:     General: There is no distension.     Tenderness: There is no abdominal tenderness. There is no rebound.  Musculoskeletal:        General: Normal range of motion.     Cervical back: Neck supple.     Comments: Tender lumbar spine and  some right flank tenderness  Lymphadenopathy:     Cervical: No cervical adenopathy.  Skin:    Findings: No erythema or rash.  Neurological:     Mental Status: She is alert and oriented to person, place, and time.     Motor: No abnormal muscle tone.     Coordination: Coordination normal.  Psychiatric:        Behavior: Behavior normal.     ED Results / Procedures / Treatments   Labs (all labs ordered are listed, but only abnormal results are displayed) Labs Reviewed  CBC WITH DIFFERENTIAL/PLATELET - Abnormal; Notable for the following components:      Result Value   RBC 5.19 (*)    Hemoglobin 16.2 (*)    HCT 50.2 (*)    All other components within normal limits  COMPREHENSIVE METABOLIC PANEL - Abnormal; Notable for the following components:   Glucose, Bld 115 (*)    Calcium 8.6 (*)    All other components within normal limits  URINALYSIS, ROUTINE W REFLEX MICROSCOPIC    EKG None  Radiology CT Renal Stone Study  Result Date: 01/31/2021 CLINICAL DATA:  RIGHT-side back and flank pain since yesterday question kidney stone EXAM: CT ABDOMEN AND PELVIS WITHOUT CONTRAST TECHNIQUE: Multidetector CT imaging of the abdomen and pelvis was performed following the standard protocol without IV contrast. Sagittal and coronal MPR images reconstructed from axial data set. No oral contrast administered. COMPARISON:  None FINDINGS: Lower chest: Dependent atelectasis at lung bases Hepatobiliary: Dependent calculi within gallbladder. Liver unremarkable. Pancreas: Normal appearance Spleen: Normal appearance.  Tiny splenule is anterior to spleen. Adrenals/Urinary Tract: Adrenal glands normal appearance. Small nonobstructing LEFT renal calculi. Tiny calculus at RIGHT renal pelvis. No renal mass, hydronephrosis,, hydroureter, or ureteral calcification. Stomach/Bowel: Normal appendix. Stomach and bowel loops normal appearance Vascular/Lymphatic: Atherosclerotic calcifications aorta and iliac arteries without  aneurysm. No adenopathy. Scattered pelvic phleboliths. Reproductive: Unremarkable uterus and ovaries Other: No free air or free fluid. No hernia or inflammatory process. Musculoskeletal: Multifactorial central choir spinal stenosis at L3-L4 and L4-L5. IMPRESSION: Small nonobstructing LEFT renal calculi and tiny RIGHT renal pelvic calculus. Cholelithiasis. Multifactorial spinal stenosis of lumbar spine at L3-L4 and L4-L5. No acute intra-abdominal or intrapelvic abnormalities. Aortic Atherosclerosis (ICD10-I70.0). Electronically Signed   By: Lavonia Dana M.D.   On: 01/31/2021 11:54    Procedures Procedures   Medications Ordered in ED Medications  HYDROmorphone (DILAUDID) injection 1 mg (1 mg Intravenous Given 01/31/21 0857)  ondansetron (ZOFRAN) injection 4 mg (4 mg Intravenous Given 01/31/21 0857)    ED Course  I have reviewed the triage vital signs and the nursing notes.  Pertinent labs & imaging results that were available during my care of  the patient were reviewed by me and considered in my medical decision making (see chart for details). Patient with back pain.  No ureteral stones but patient does have known ureteral stones.  She also has severe arthritis in her lower back.   MDM Rules/Calculators/A&P                          Right flank pain and back pain most likely related to degenerative changes in her back.  She is given some pain medicine will follow up with her PCP Final Clinical Impression(s) / ED Diagnoses Final diagnoses:  Right flank pain    Rx / DC Orders ED Discharge Orders         Ordered    oxyCODONE-acetaminophen (PERCOCET) 5-325 MG tablet  Every 6 hours PRN        01/31/21 1532           Milton Ferguson, MD 02/01/21 1655

## 2021-01-31 NOTE — Discharge Instructions (Signed)
Follow-up with your family doctor in the next week or 2 for recheck

## 2021-01-31 NOTE — ED Triage Notes (Signed)
Pt presents to ED with complaints of right sided back pain and flank pain started yesterday. Denies urinary symptoms.

## 2021-02-05 DIAGNOSIS — R944 Abnormal results of kidney function studies: Secondary | ICD-10-CM | POA: Diagnosis not present

## 2021-02-05 DIAGNOSIS — I5032 Chronic diastolic (congestive) heart failure: Secondary | ICD-10-CM | POA: Diagnosis not present

## 2021-02-05 DIAGNOSIS — E1122 Type 2 diabetes mellitus with diabetic chronic kidney disease: Secondary | ICD-10-CM | POA: Diagnosis not present

## 2021-02-05 DIAGNOSIS — R350 Frequency of micturition: Secondary | ICD-10-CM | POA: Diagnosis not present

## 2021-02-05 DIAGNOSIS — R252 Cramp and spasm: Secondary | ICD-10-CM | POA: Diagnosis not present

## 2021-02-05 DIAGNOSIS — M545 Low back pain, unspecified: Secondary | ICD-10-CM | POA: Diagnosis not present

## 2021-02-05 DIAGNOSIS — F17228 Nicotine dependence, chewing tobacco, with other nicotine-induced disorders: Secondary | ICD-10-CM | POA: Diagnosis not present

## 2021-02-05 DIAGNOSIS — J449 Chronic obstructive pulmonary disease, unspecified: Secondary | ICD-10-CM | POA: Diagnosis not present

## 2021-02-05 DIAGNOSIS — D45 Polycythemia vera: Secondary | ICD-10-CM | POA: Diagnosis not present

## 2021-02-05 DIAGNOSIS — B354 Tinea corporis: Secondary | ICD-10-CM | POA: Diagnosis not present

## 2021-02-05 DIAGNOSIS — E875 Hyperkalemia: Secondary | ICD-10-CM | POA: Diagnosis not present

## 2021-02-05 DIAGNOSIS — L209 Atopic dermatitis, unspecified: Secondary | ICD-10-CM | POA: Diagnosis not present

## 2021-02-05 DIAGNOSIS — N1831 Chronic kidney disease, stage 3a: Secondary | ICD-10-CM | POA: Diagnosis not present

## 2021-02-05 DIAGNOSIS — R7301 Impaired fasting glucose: Secondary | ICD-10-CM | POA: Diagnosis not present

## 2021-02-05 DIAGNOSIS — E782 Mixed hyperlipidemia: Secondary | ICD-10-CM | POA: Diagnosis not present

## 2021-02-13 ENCOUNTER — Emergency Department (HOSPITAL_COMMUNITY): Payer: PPO

## 2021-02-13 ENCOUNTER — Other Ambulatory Visit: Payer: Self-pay

## 2021-02-13 ENCOUNTER — Emergency Department (HOSPITAL_COMMUNITY)
Admission: EM | Admit: 2021-02-13 | Discharge: 2021-02-13 | Disposition: A | Discharge status: Home / Self Care | Payer: PPO | Attending: Emergency Medicine | Admitting: Emergency Medicine

## 2021-02-13 ENCOUNTER — Encounter (HOSPITAL_COMMUNITY): Payer: Self-pay

## 2021-02-13 DIAGNOSIS — J449 Chronic obstructive pulmonary disease, unspecified: Secondary | ICD-10-CM | POA: Insufficient documentation

## 2021-02-13 DIAGNOSIS — I509 Heart failure, unspecified: Secondary | ICD-10-CM | POA: Diagnosis not present

## 2021-02-13 DIAGNOSIS — I110 Hypertensive heart disease with heart failure: Secondary | ICD-10-CM | POA: Insufficient documentation

## 2021-02-13 DIAGNOSIS — Y92410 Unspecified street and highway as the place of occurrence of the external cause: Secondary | ICD-10-CM | POA: Insufficient documentation

## 2021-02-13 DIAGNOSIS — Z79899 Other long term (current) drug therapy: Secondary | ICD-10-CM | POA: Insufficient documentation

## 2021-02-13 DIAGNOSIS — M545 Low back pain, unspecified: Secondary | ICD-10-CM

## 2021-02-13 DIAGNOSIS — F17210 Nicotine dependence, cigarettes, uncomplicated: Secondary | ICD-10-CM | POA: Diagnosis not present
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
## 2021-02-13 NOTE — ED Notes (Signed)
Patient transported to CT 

## 2021-02-13 NOTE — ED Notes (Signed)
Pt left prior to receiving discharge paperwork  

## 2021-02-13 NOTE — Discharge Instructions (Addendum)
Follow-up with the MRI as planned.

## 2021-07-16 DIAGNOSIS — Z0000 Encounter for general adult medical examination without abnormal findings: Secondary | ICD-10-CM | POA: Diagnosis not present

## 2021-07-18 DIAGNOSIS — F411 Generalized anxiety disorder: Secondary | ICD-10-CM | POA: Diagnosis not present

## 2021-07-18 DIAGNOSIS — F17200 Nicotine dependence, unspecified, uncomplicated: Secondary | ICD-10-CM | POA: Diagnosis not present

## 2021-07-18 DIAGNOSIS — R7303 Prediabetes: Secondary | ICD-10-CM | POA: Diagnosis not present

## 2021-07-18 DIAGNOSIS — Z716 Tobacco abuse counseling: Secondary | ICD-10-CM | POA: Diagnosis not present

## 2021-07-18 DIAGNOSIS — J302 Other seasonal allergic rhinitis: Secondary | ICD-10-CM | POA: Diagnosis not present

## 2021-07-18 DIAGNOSIS — E782 Mixed hyperlipidemia: Secondary | ICD-10-CM | POA: Diagnosis not present

## 2021-07-18 DIAGNOSIS — Z6839 Body mass index (BMI) 39.0-39.9, adult: Secondary | ICD-10-CM | POA: Diagnosis not present

## 2021-07-18 DIAGNOSIS — M545 Low back pain, unspecified: Secondary | ICD-10-CM | POA: Diagnosis not present

## 2021-07-18 DIAGNOSIS — J449 Chronic obstructive pulmonary disease, unspecified: Secondary | ICD-10-CM | POA: Diagnosis not present

## 2022-01-16 DIAGNOSIS — R7301 Impaired fasting glucose: Secondary | ICD-10-CM | POA: Diagnosis not present

## 2022-01-16 DIAGNOSIS — E782 Mixed hyperlipidemia: Secondary | ICD-10-CM | POA: Diagnosis not present

## 2022-01-21 DIAGNOSIS — F411 Generalized anxiety disorder: Secondary | ICD-10-CM | POA: Diagnosis not present

## 2022-01-21 DIAGNOSIS — M545 Low back pain, unspecified: Secondary | ICD-10-CM | POA: Diagnosis not present

## 2022-01-21 DIAGNOSIS — N1831 Chronic kidney disease, stage 3a: Secondary | ICD-10-CM | POA: Diagnosis not present

## 2022-01-21 DIAGNOSIS — F17200 Nicotine dependence, unspecified, uncomplicated: Secondary | ICD-10-CM | POA: Diagnosis not present

## 2022-01-21 DIAGNOSIS — R202 Paresthesia of skin: Secondary | ICD-10-CM | POA: Diagnosis not present

## 2022-01-21 DIAGNOSIS — Z716 Tobacco abuse counseling: Secondary | ICD-10-CM | POA: Diagnosis not present

## 2022-01-21 DIAGNOSIS — J449 Chronic obstructive pulmonary disease, unspecified: Secondary | ICD-10-CM | POA: Diagnosis not present

## 2022-01-21 DIAGNOSIS — J302 Other seasonal allergic rhinitis: Secondary | ICD-10-CM | POA: Diagnosis not present

## 2022-01-21 DIAGNOSIS — Z6839 Body mass index (BMI) 39.0-39.9, adult: Secondary | ICD-10-CM | POA: Diagnosis not present

## 2022-01-21 DIAGNOSIS — R7303 Prediabetes: Secondary | ICD-10-CM | POA: Diagnosis not present

## 2022-01-21 DIAGNOSIS — D75839 Thrombocytosis, unspecified: Secondary | ICD-10-CM | POA: Diagnosis not present

## 2022-01-21 DIAGNOSIS — E782 Mixed hyperlipidemia: Secondary | ICD-10-CM | POA: Diagnosis not present

## 2022-05-22 DIAGNOSIS — E782 Mixed hyperlipidemia: Secondary | ICD-10-CM | POA: Diagnosis not present

## 2022-05-22 DIAGNOSIS — N1831 Chronic kidney disease, stage 3a: Secondary | ICD-10-CM | POA: Diagnosis not present

## 2022-05-22 DIAGNOSIS — E119 Type 2 diabetes mellitus without complications: Secondary | ICD-10-CM | POA: Diagnosis not present

## 2022-05-22 DIAGNOSIS — J449 Chronic obstructive pulmonary disease, unspecified: Secondary | ICD-10-CM | POA: Diagnosis not present

## 2022-05-28 DIAGNOSIS — M545 Low back pain, unspecified: Secondary | ICD-10-CM | POA: Diagnosis not present

## 2022-05-28 DIAGNOSIS — R202 Paresthesia of skin: Secondary | ICD-10-CM | POA: Diagnosis not present

## 2022-05-29 IMAGING — DX DG LUMBAR SPINE 2-3V
3 series · 3 of 3 positions shown · non-contrast
Comparison: None.

CLINICAL DATA: Low back pain

EXAM:
LUMBAR SPINE - 2-3 VIEW

[l-spine ap]
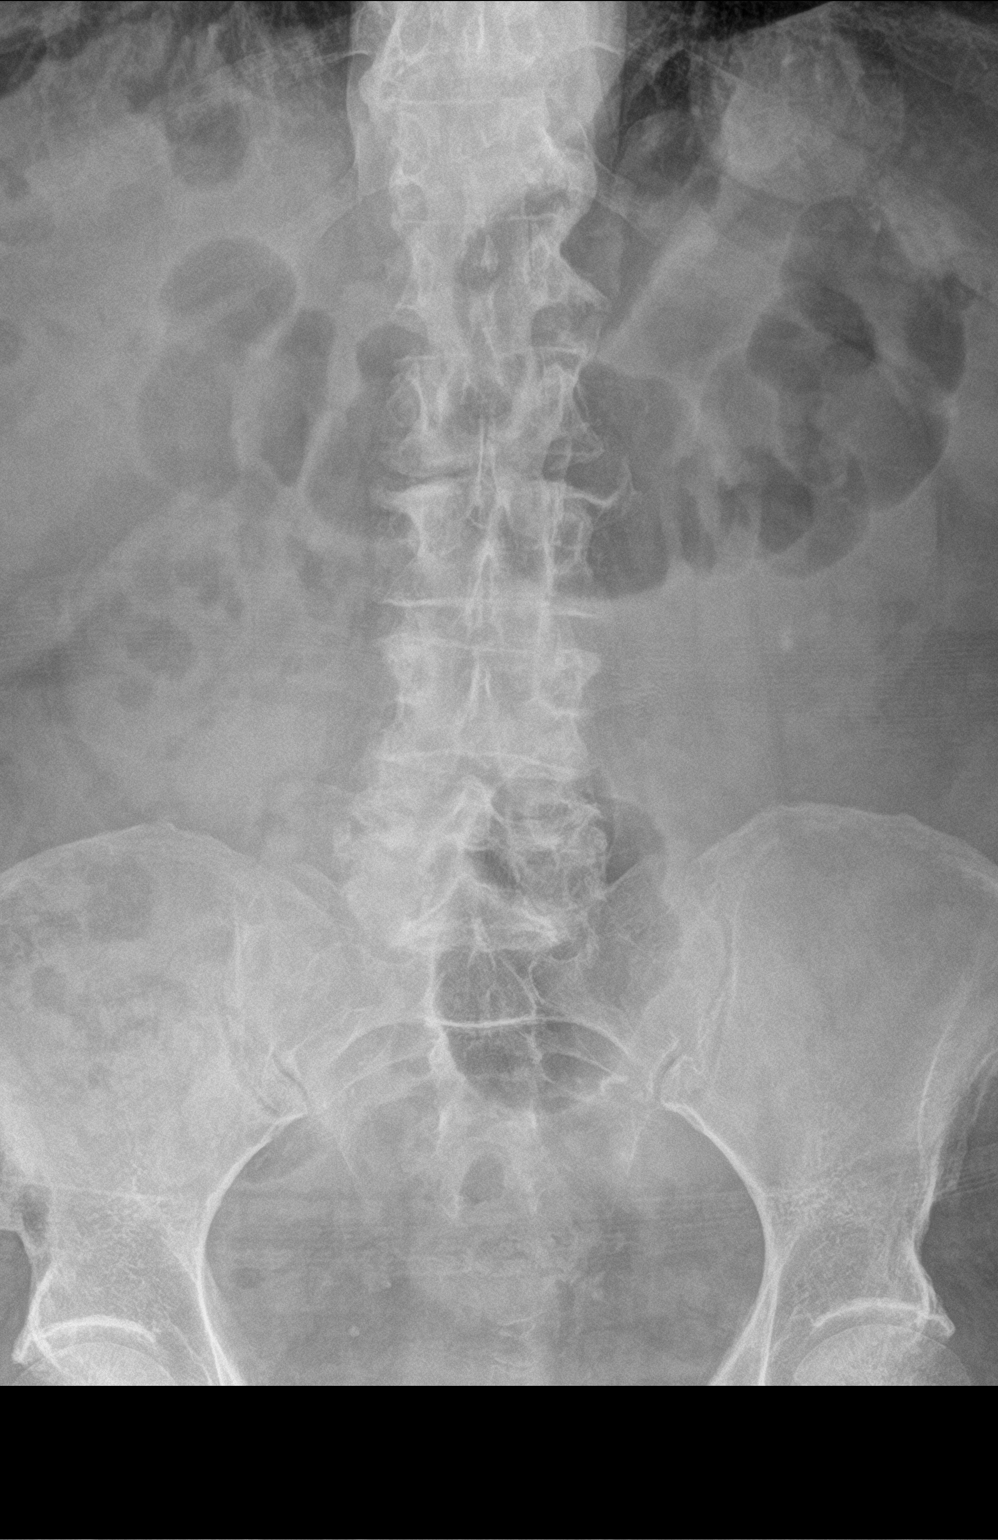

[l-spine lat]
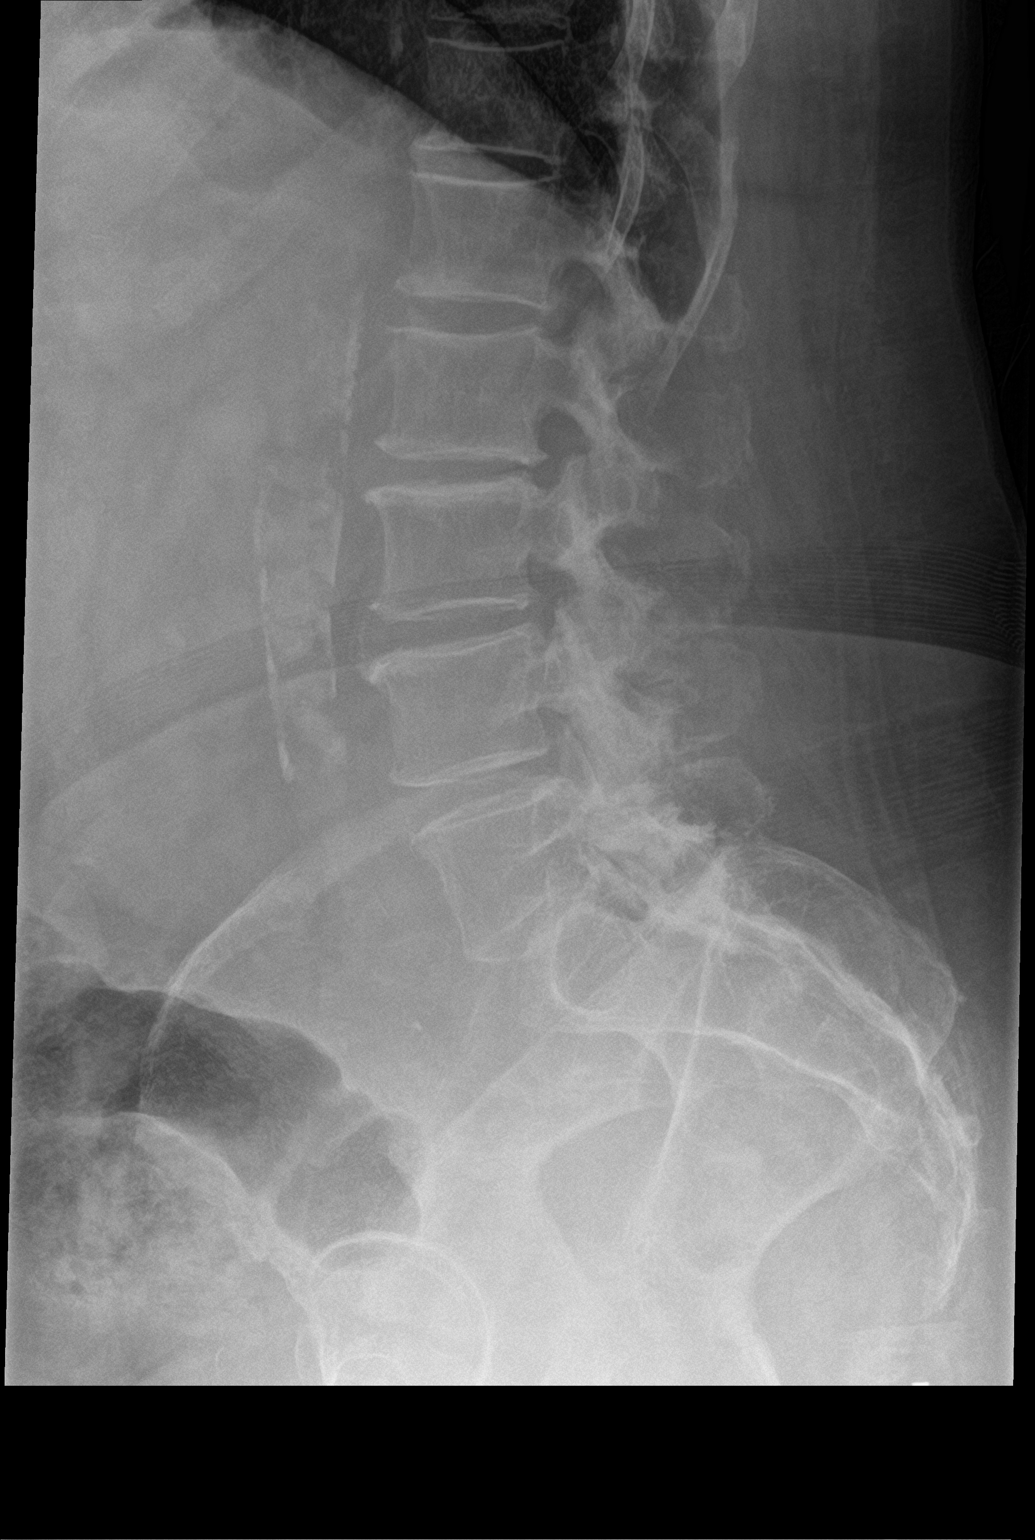

[l-spine spot]
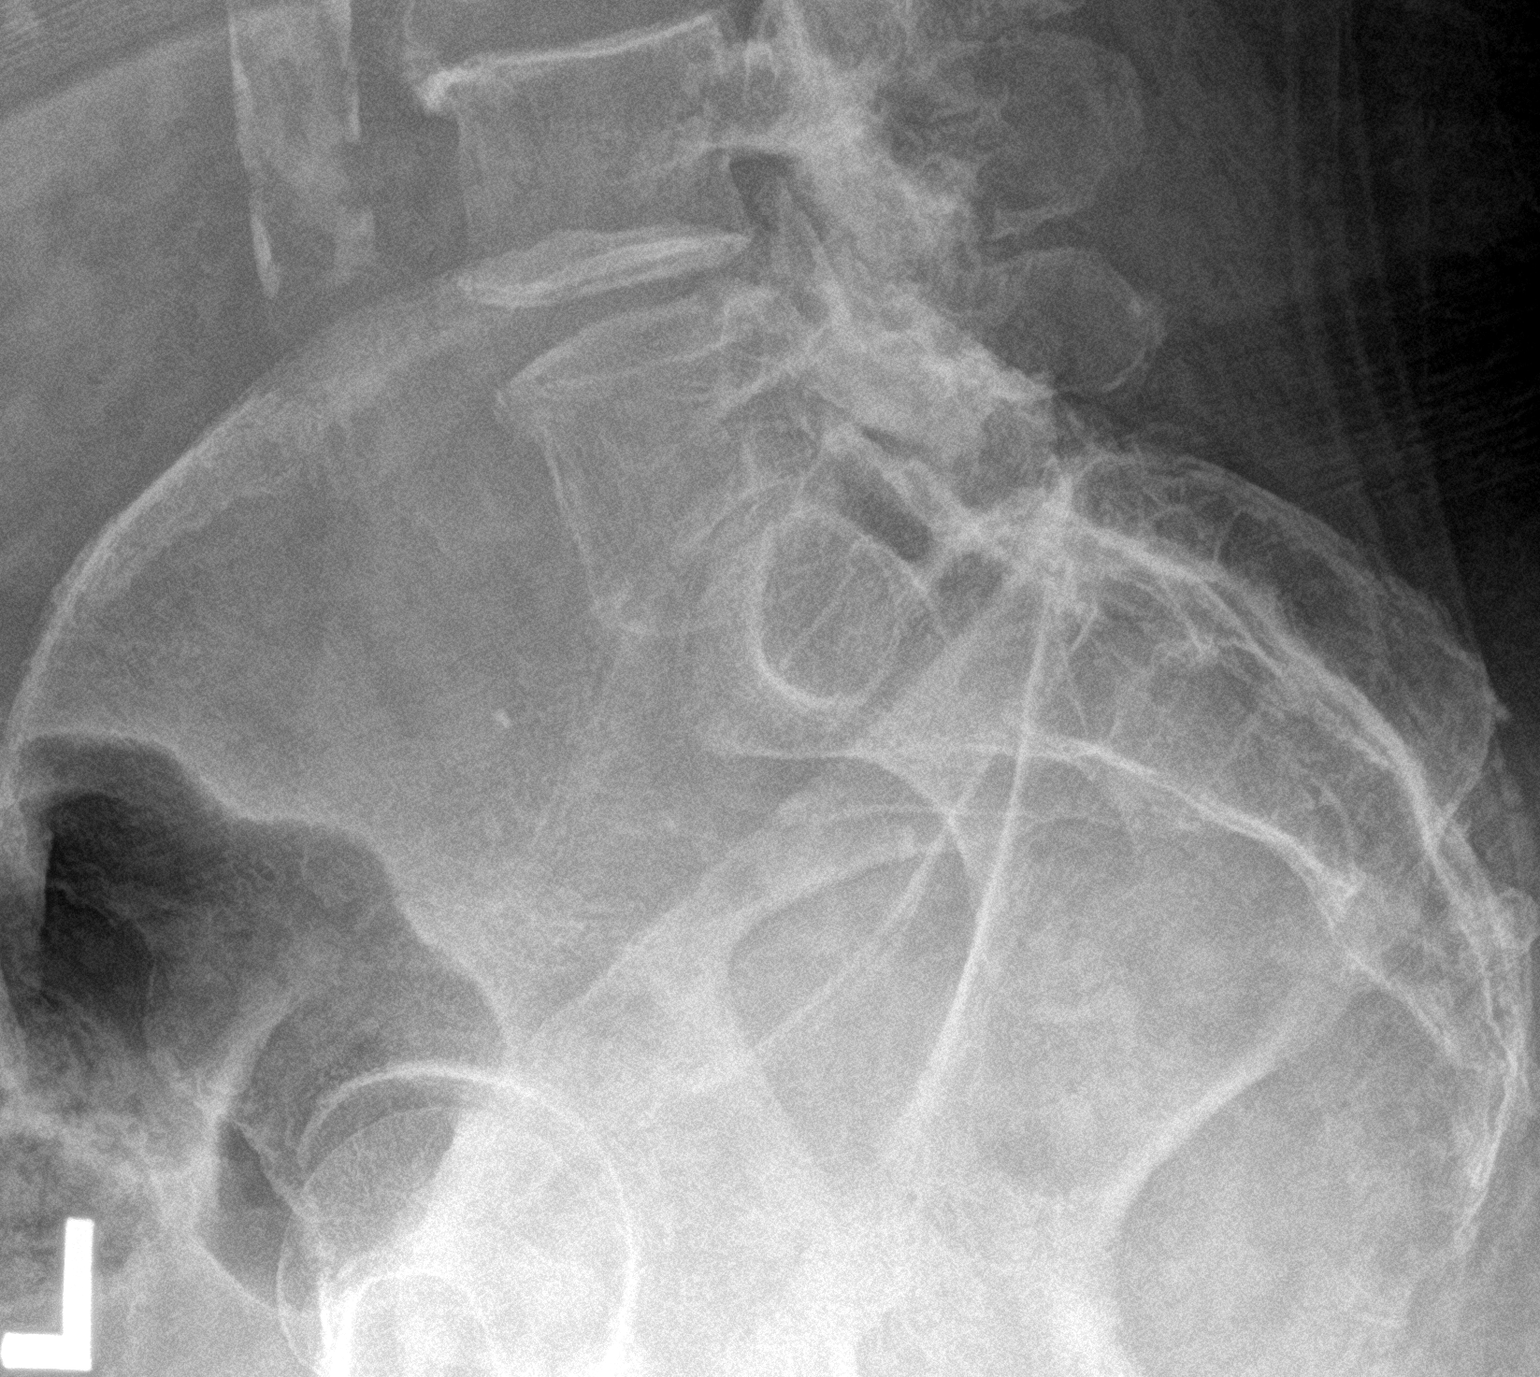

[3 of 3 positions shown; findings below may reference images not displayed]

FINDINGS: Normal lumbar lordosis. No acute fracture or listhesis of the lumbar
spine. Vertebral body height has been preserved. There is
intervertebral disc space narrowing and endplate remodeling of
L1-L4, most severe at L2-3, in keeping with changes of mild
degenerative disc disease. Facet arthrosis at L3-S1 is not well
profiled on this examination. The paraspinal soft tissues are
unremarkable. Vascular calcifications are seen within the abdominal
aorta. 8 mm calculus noted within the lower pole of the left kidney.
IMPRESSION: Mild degenerative disc and degenerative joint disease throughout the
lumbar spine. No acute fracture or listhesis.

Left nephrolithiasis.

Peripheral vascular disease.

## 2022-06-24 DIAGNOSIS — M5442 Lumbago with sciatica, left side: Secondary | ICD-10-CM | POA: Diagnosis not present

## 2022-06-24 DIAGNOSIS — M5441 Lumbago with sciatica, right side: Secondary | ICD-10-CM | POA: Diagnosis not present

## 2022-06-24 DIAGNOSIS — G8929 Other chronic pain: Secondary | ICD-10-CM | POA: Diagnosis not present

## 2022-06-30 ENCOUNTER — Other Ambulatory Visit (HOSPITAL_COMMUNITY): Payer: Self-pay | Admitting: Neurosurgery

## 2022-06-30 DIAGNOSIS — G8929 Other chronic pain: Secondary | ICD-10-CM

## 2022-07-25 ENCOUNTER — Ambulatory Visit (HOSPITAL_COMMUNITY)
Admission: RE | Admit: 2022-07-25 | Discharge: 2022-07-25 | Disposition: A | Discharge status: Home / Self Care | Payer: PPO | Source: Ambulatory Visit | Attending: Neurosurgery | Admitting: Neurosurgery

## 2022-07-25 DIAGNOSIS — M5441 Lumbago with sciatica, right side: Secondary | ICD-10-CM | POA: Diagnosis not present

## 2022-07-25 DIAGNOSIS — M5442 Lumbago with sciatica, left side: Secondary | ICD-10-CM | POA: Diagnosis not present

## 2022-07-25 DIAGNOSIS — M5126 Other intervertebral disc displacement, lumbar region: Secondary | ICD-10-CM | POA: Diagnosis not present

## 2022-07-25 DIAGNOSIS — G8929 Other chronic pain: Secondary | ICD-10-CM | POA: Insufficient documentation

## 2022-07-29 DIAGNOSIS — M4316 Spondylolisthesis, lumbar region: Secondary | ICD-10-CM | POA: Diagnosis not present

## 2022-07-29 DIAGNOSIS — M48062 Spinal stenosis, lumbar region with neurogenic claudication: Secondary | ICD-10-CM | POA: Diagnosis not present

## 2022-07-29 DIAGNOSIS — Z6838 Body mass index (BMI) 38.0-38.9, adult: Secondary | ICD-10-CM | POA: Diagnosis not present

## 2022-08-25 ENCOUNTER — Ambulatory Visit
Admission: EM | Admit: 2022-08-25 | Discharge: 2022-08-25 | Disposition: A | Discharge status: Home / Self Care | Payer: PPO | Attending: Nurse Practitioner | Admitting: Nurse Practitioner

## 2022-08-25 DIAGNOSIS — N3001 Acute cystitis with hematuria: Secondary | ICD-10-CM | POA: Diagnosis not present

## 2022-08-25 LAB — POCT URINALYSIS DIP (MANUAL ENTRY)
Bilirubin, UA: NEGATIVE
Glucose, UA: NEGATIVE mg/dL
Leukocytes, UA: NEGATIVE
Nitrite, UA: NEGATIVE
Protein Ur, POC: 100 mg/dL — AB
Spec Grav, UA: 1.015 (ref 1.010–1.025)
Urobilinogen, UA: 2 E.U./dL — AB
pH, UA: 6.5 (ref 5.0–8.0)

## 2022-08-25 MED ORDER — SULFAMETHOXAZOLE-TRIMETHOPRIM 800-160 MG PO TABS: 1.0000 | ORAL_TABLET | Freq: Two times a day (BID) | ORAL | 0 refills | Status: AC

## 2022-08-25 NOTE — ED Provider Notes (Signed)
[facility]    CSN: [id number] Arrival date & time: 08/25/22  1218      History   Chief Complaint Chief Complaint  Patient presents with   Dysuria         HPI Brittany Bass is a 71 y.o. female.   Patient presents for 1 day of dysuria, urinary frequency and urgency (which is baseline per patient).  Also endorses voiding smaller amounts, stronger urinary odor, suprapubic pressure.  Patient denies new urinary incontinence, hematuria, abdominal or back pain, flank pain, fever, nausea/vomiting, body aches, and chills.  Reports last UTI was "years ago."  No vaginal discharge that she is aware of.  Has not taken anything for symptoms so far.  Medical history significant for COPD, CHF, essential hypertension, obesity.  Also noted last PCP visit stated CKD stage IIIa.  Patient denies antibiotic use in the past 90 days.  Reports that she had allergic reaction of hives to penicillin.    Past Medical History:  Diagnosis Date   COPD (chronic obstructive pulmonary disease) ([place])    Kidney stone    Osteoporosis     Patient Active Problem List   Diagnosis Date Noted   Acute respiratory failure with hypoxia ([place]) 03/12/2019   COPD with acute exacerbation ([place]) 03/12/2019   Acute CHF (congestive heart failure) ([place]) 03/12/2019   Obesity 03/12/2019   Essential hypertension 03/12/2019    Past Surgical History:  Procedure Laterality Date   CESAREAN SECTION     jaundice      OB History   No obstetric history on file.      Home Medications    Prior to Admission medications   Medication Sig Start Date End Date Taking? Authorizing Provider  sulfamethoxazole-trimethoprim (BACTRIM DS) 800-160 MG tablet Take 1 tablet by mouth 2 (two) times daily for 5 days. 08/25/22 08/30/22 Yes Noemi Chapel A, NP  TRELEGY ELLIPTA 100-62.5-25 MCG/ACT AEPB Take 1 puff by mouth daily. 07/12/22  Yes [provider]  furosemide (LASIX) 20 MG tablet Take 1 tablet (20 mg total) by  mouth daily. 03/15/19 03/14/20  Manuella Ghazi, Pratik D, DO  Ipratropium-Albuterol (COMBIVENT RESPIMAT) 20-100 MCG/ACT AERS respimat Inhale 1 puff into the lungs every 6 (six) hours as needed for wheezing or shortness of breath. 03/15/19   Manuella Ghazi, Pratik D, DO  oxyCODONE-acetaminophen (PERCOCET) 5-325 MG tablet Take 1 tablet by mouth every 6 (six) hours as needed. 01/31/21   Milton Ferguson, MD  VITAMIN D PO Take 1 tablet by mouth daily.    [provider]    Family History Family History  Problem Relation Age of Onset   Breast cancer Sister     Social History Social History   Tobacco Use   Smoking status: Every Day    Packs/day: 1.00    Types: Cigarettes   Smokeless tobacco: Never  Vaping Use   Vaping Use: Former  Substance Use Topics   Alcohol use: No   Drug use: No     Allergies   Shellfish allergy, Hydrocodone, and Penicillins   Review of Systems Review of Systems Per HPI  Physical Exam Triage Vital Signs ED Triage Vitals [08/25/22 1442]  Enc Vitals Group     BP (!) 177/83     Pulse Rate 69     Resp 18     Temp 97.6 F (36.4 C)     Temp Source Oral     SpO2 95 %     Weight      Height  Head Circumference      Peak Flow      Pain Score 0     Pain Loc      Pain Edu?      Excl. in [place]?    No data found.  Updated Vital Signs BP (!) 177/83 (BP Location: Right Wrist)   Pulse 69   Temp 97.6 F (36.4 C) (Oral)   Resp 18   SpO2 95%   Visual Acuity Right Eye Distance:   Left Eye Distance:   Bilateral Distance:    Right Eye Near:   Left Eye Near:    Bilateral Near:     Physical Exam Vitals and nursing note reviewed.  Constitutional:      General: She is not in acute distress.    Appearance: She is not toxic-appearing.  Pulmonary:     Effort: Pulmonary effort is normal. No respiratory distress.  Abdominal:     General: Abdomen is flat. Bowel sounds are normal. There is no distension.     Palpations: Abdomen is soft. There is no mass.      Tenderness: There is no abdominal tenderness. There is no right CVA tenderness, left CVA tenderness or guarding.  Skin:    General: Skin is warm and dry.     Coloration: Skin is not jaundiced or pale.     Findings: No erythema.  Neurological:     Mental Status: She is alert and oriented to person, place, and time.     Motor: No weakness.     Gait: Gait normal.  Psychiatric:        Behavior: Behavior is cooperative.      UC Treatments / Results  Labs (all labs ordered are listed, but only abnormal results are displayed) Labs Reviewed  POCT URINALYSIS DIP (MANUAL ENTRY) - Abnormal; Notable for the following components:      Result Value   Clarity, UA cloudy (*)    Ketones, POC UA trace (5) (*)    Blood, UA small (*)    Protein Ur, POC =100 (*)    Urobilinogen, UA 2.0 (*)    All other components within normal limits  URINE CULTURE    EKG   Radiology No results found.  Procedures Procedures (including critical care time)  Medications Ordered in UC Medications - No data to display  Initial Impression / Assessment and Plan / UC Course  I have reviewed the triage vital signs and the nursing notes.  Pertinent labs & imaging results that were available during my care of the patient were reviewed by me and considered in my medical decision making (see chart for details).   Patient is well-appearing, afebrile, not tachycardic, not tachypneic, oxygenating well on room air.  She is mildly hypertensive today, likely secondary to urgent care setting.  She is not symptomatic of elevated blood pressure today.  Acute cystitis with hematuria Urinalysis unremarkable with exception of small amount of blood.  Urine culture pending Will treat with Bactrim DS twice daily for 5 days CrCl calculated as greater than 30, given CKD stage IIIa no dose change needed Strict ER precautions discussed with patient  The patient was given the opportunity to ask questions.  All questions answered to  their satisfaction.  The patient is in agreement to this plan.    Final Clinical Impressions(s) / UC Diagnoses   Final diagnoses:  Acute cystitis with hematuria     Discharge Instructions      Urinalysis today shows a small amount  of blood.  Urine culture is pending.  I am suspicious that you have a bladder infection based on the blood in your urine and the symptoms we discussed today.  Please take the Bactrim as prescribed to treat the possible UTI.  We will call you in a couple of days if we need to change the antibiotic that you are on based on the urine culture results.  If you develop severe pain, high fever, nausea/vomiting and are unable to keep fluids down, please go to emergency room.     ED Prescriptions     Medication Sig Dispense Auth. Provider   sulfamethoxazole-trimethoprim (BACTRIM DS) 800-160 MG tablet Take 1 tablet by mouth 2 (two) times daily for 5 days. 10 tablet Eulogio Bear, NP      PDMP not reviewed this encounter.   Eulogio Bear, NP 08/25/22 1534

## 2022-08-25 NOTE — ED Triage Notes (Signed)
Pt reports burning sensation when urinating since this morning.

## 2022-08-25 NOTE — Discharge Instructions (Addendum)
Urinalysis today shows a small amount of blood.  Urine culture is pending.  I am suspicious that you have a bladder infection based on the blood in your urine and the symptoms we discussed today.  Please take the Bactrim as prescribed to treat the possible UTI.  We will call you in a couple of days if we need to change the antibiotic that you are on based on the urine culture results.  If you develop severe pain, high fever, nausea/vomiting and are unable to keep fluids down, please go to emergency room.

## 2022-08-27 ENCOUNTER — Ambulatory Visit (HOSPITAL_COMMUNITY): Payer: PPO | Attending: Neurosurgery | Admitting: Physical Therapy

## 2022-08-27 DIAGNOSIS — M5459 Other low back pain: Secondary | ICD-10-CM | POA: Insufficient documentation

## 2022-08-27 LAB — URINE CULTURE
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
## 2022-09-10 ENCOUNTER — Telehealth (HOSPITAL_COMMUNITY): Payer: Self-pay | Admitting: Physical Therapy

## 2022-09-10 ENCOUNTER — Encounter (HOSPITAL_COMMUNITY): Payer: PPO | Admitting: Physical Therapy

## 2022-09-10 NOTE — Telephone Encounter (Signed)
First no show:  Called pt., no answer.  Therapist left message reminding pt of next appointment and no show policy.  Rayetta Humphrey, [place] CLT [phone]

## 2022-09-11 DIAGNOSIS — B3731 Acute candidiasis of vulva and vagina: Secondary | ICD-10-CM | POA: Diagnosis not present

## 2022-09-11 DIAGNOSIS — N390 Urinary tract infection, site not specified: Secondary | ICD-10-CM | POA: Diagnosis not present

## 2022-09-11 DIAGNOSIS — R319 Hematuria, unspecified: Secondary | ICD-10-CM | POA: Diagnosis not present

## 2022-09-24 ENCOUNTER — Ambulatory Visit (HOSPITAL_COMMUNITY): Payer: PPO | Attending: Neurosurgery | Admitting: Physical Therapy

## 2022-09-24 DIAGNOSIS — M5459 Other low back pain: Secondary | ICD-10-CM | POA: Diagnosis not present
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
## 2022-09-30 ENCOUNTER — Encounter (HOSPITAL_COMMUNITY): Payer: PPO | Admitting: Physical Therapy

## 2022-10-07 ENCOUNTER — Encounter (HOSPITAL_COMMUNITY): Payer: PPO | Admitting: Physical Therapy

## 2022-10-14 ENCOUNTER — Ambulatory Visit (HOSPITAL_COMMUNITY): Payer: PPO | Admitting: Physical Therapy

## 2022-10-14 DIAGNOSIS — M5459 Other low back pain: Secondary | ICD-10-CM | POA: Diagnosis not present
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
## 2022-10-21 DIAGNOSIS — E782 Mixed hyperlipidemia: Secondary | ICD-10-CM | POA: Diagnosis not present

## 2022-10-22 ENCOUNTER — Encounter (HOSPITAL_COMMUNITY): Payer: PPO | Admitting: Physical Therapy

## 2022-10-27 DIAGNOSIS — R7303 Prediabetes: Secondary | ICD-10-CM | POA: Diagnosis not present

## 2022-10-27 DIAGNOSIS — Z716 Tobacco abuse counseling: Secondary | ICD-10-CM | POA: Diagnosis not present

## 2022-10-27 DIAGNOSIS — Z6839 Body mass index (BMI) 39.0-39.9, adult: Secondary | ICD-10-CM | POA: Diagnosis not present

## 2022-10-27 DIAGNOSIS — G4700 Insomnia, unspecified: Secondary | ICD-10-CM | POA: Diagnosis not present

## 2022-10-27 DIAGNOSIS — Z0001 Encounter for general adult medical examination with abnormal findings: Secondary | ICD-10-CM | POA: Diagnosis not present

## 2022-10-27 DIAGNOSIS — M545 Low back pain, unspecified: Secondary | ICD-10-CM | POA: Diagnosis not present

## 2022-10-27 DIAGNOSIS — J449 Chronic obstructive pulmonary disease, unspecified: Secondary | ICD-10-CM | POA: Diagnosis not present

## 2022-10-27 DIAGNOSIS — R202 Paresthesia of skin: Secondary | ICD-10-CM | POA: Diagnosis not present

## 2022-10-27 DIAGNOSIS — N1831 Chronic kidney disease, stage 3a: Secondary | ICD-10-CM | POA: Diagnosis not present

## 2022-10-27 DIAGNOSIS — F17200 Nicotine dependence, unspecified, uncomplicated: Secondary | ICD-10-CM | POA: Diagnosis not present

## 2022-10-27 DIAGNOSIS — E782 Mixed hyperlipidemia: Secondary | ICD-10-CM | POA: Diagnosis not present

## 2022-10-27 DIAGNOSIS — F411 Generalized anxiety disorder: Secondary | ICD-10-CM | POA: Diagnosis not present

## 2022-11-04 DIAGNOSIS — Z6836 Body mass index (BMI) 36.0-36.9, adult: Secondary | ICD-10-CM | POA: Diagnosis not present

## 2022-11-04 DIAGNOSIS — M48062 Spinal stenosis, lumbar region with neurogenic claudication: Secondary | ICD-10-CM | POA: Diagnosis not present

## 2022-11-04 DIAGNOSIS — M4316 Spondylolisthesis, lumbar region: Secondary | ICD-10-CM | POA: Diagnosis not present

## 2022-11-05 ENCOUNTER — Encounter (HOSPITAL_COMMUNITY): Payer: PPO | Admitting: Physical Therapy

## 2022-11-05 ENCOUNTER — Telehealth (HOSPITAL_COMMUNITY): Payer: Self-pay | Admitting: Physical Therapy

## 2022-11-05 NOTE — Telephone Encounter (Signed)
Called patient about missed visit and to call and schedule if wanting to remain in therapy at this time, as today was last scheduled appointment time.   9:44 AM, 11/05/22 Josue Hector PT DPT  Physical Therapist with [facility]  [phone]

## 2022-11-19 ENCOUNTER — Ambulatory Visit (INDEPENDENT_AMBULATORY_CARE_PROVIDER_SITE_OTHER): Payer: PPO | Admitting: Urology

## 2022-11-19 ENCOUNTER — Encounter: Payer: Self-pay | Admitting: Urology

## 2022-11-19 VITALS — BP 99/61 | HR 85

## 2022-11-19 DIAGNOSIS — R310 Gross hematuria: Secondary | ICD-10-CM | POA: Diagnosis not present

## 2022-11-19 NOTE — Patient Instructions (Signed)

## 2022-11-19 NOTE — Progress Notes (Unsigned)
11/19/2022 11:07 AM   Brittany Bass [birth year] BO:[id number]  Referring provider: Valentino Nose, FNP [address] [place],  [place] [address]  microhematuria   HPI: [name] is a 72yo here for evaluation of microhematuria. She had a UA while being treated for a UTI which showed small blood. No microscopy was available. She denies any significant LUTS. No gross hematuria and no dysuria She has a 50pk year smoking hx. UA from 08/25/2022 shows small blood. UA today shows no blood.   PMH: Past Medical History:  Diagnosis Date   COPD (chronic obstructive pulmonary disease) ([place])    Kidney stone    Osteoporosis     Surgical History: Past Surgical History:  Procedure Laterality Date   CESAREAN SECTION     jaundice      Home Medications:  Allergies as of 11/19/2022       Reactions   Shellfish Allergy Anaphylaxis   Hydrocodone    Nausea and "cold sweat"   Penicillins Hives, Nausea And Vomiting   .Did it involve swelling of the face/tongue/throat, SOB, or low BP? Yes Did it involve sudden or severe rash/hives, skin peeling, or any reaction on the inside of your mouth or Bass? Yes Did you need to seek medical attention at a hospital or doctor's office? Yes When did it last happen?       If all above answers are "NO", may proceed with cephalosporin use.        Medication List        Accurate as of November 19, 2022 11:07 AM. If you have any questions, ask your nurse or doctor.          clonazePAM 0.5 MG tablet Commonly known as: KLONOPIN Take 0.5 mg by mouth at bedtime.   Combivent Respimat 20-100 MCG/ACT Aers respimat Generic drug: Ipratropium-Albuterol Inhale 1 puff into the lungs every 6 (six) hours as needed for wheezing or shortness of breath.   escitalopram 5 MG tablet Commonly known as: LEXAPRO Take 5 mg by mouth daily.   furosemide 20 MG tablet Commonly known as: Lasix Take 1 tablet (20 mg total) by mouth daily.   oxyCODONE-acetaminophen 5-325  MG tablet Commonly known as: Percocet Take 1 tablet by mouth every 6 (six) hours as needed.   Trelegy Ellipta 100-62.5-25 MCG/ACT Aepb Generic drug: Fluticasone-Umeclidin-Vilant Take 1 puff by mouth daily.   VITAMIN D PO Take 1 tablet by mouth daily.        Allergies:  Allergies  Allergen Reactions   Shellfish Allergy Anaphylaxis   Hydrocodone     Nausea and "cold sweat"   Penicillins Hives and Nausea And Vomiting    .Did it involve swelling of the face/tongue/throat, SOB, or low BP? Yes Did it involve sudden or severe rash/hives, skin peeling, or any reaction on the inside of your mouth or Bass? Yes Did you need to seek medical attention at a hospital or doctor's office? Yes When did it last happen?       If all above answers are "NO", may proceed with cephalosporin use.     Family History: Family History  Problem Relation Age of Onset   Breast cancer Sister     Social History:  reports that she has been smoking cigarettes. She has been smoking an average of 1 pack per day. She has never used smokeless tobacco. She reports that she does not drink alcohol and does not use drugs.  ROS: All other review of systems were reviewed  and are negative except what is noted above in HPI  Physical Exam: BP 99/61   Pulse 85   Constitutional:  Alert and oriented, No acute distress. HEENT: [place] AT, moist mucus membranes.  Trachea midline, no masses. Cardiovascular: No clubbing, cyanosis, or edema. Respiratory: Normal respiratory effort, no increased work of breathing. GI: Abdomen is soft, nontender, nondistended, no abdominal masses GU: No CVA tenderness.  Lymph: No cervical or inguinal lymphadenopathy. Skin: No rashes, bruises or suspicious lesions. Neurologic: Grossly intact, no focal deficits, moving all 4 extremities. Psychiatric: Normal mood and affect.  Laboratory Data: Lab Results  Component Value Date   WBC 6.2 01/31/2021   HGB 16.2 (H) 01/31/2021   HCT 50.2 (H)  01/31/2021   MCV 96.7 01/31/2021   PLT 208 01/31/2021    Lab Results  Component Value Date   CREATININE 0.89 01/31/2021    No results found for: "PSA"  No results found for: "TESTOSTERONE"  No results found for: "HGBA1C"  Urinalysis    Component Value Date/Time   COLORURINE YELLOW 01/31/2021 [place] 01/31/2021 1455   LABSPEC 1.019 01/31/2021 1455   PHURINE 5.0 01/31/2021 1455   GLUCOSEU NEGATIVE 01/31/2021 1455   [place] 01/31/2021 1455   BILIRUBINUR negative 08/25/2022 1457   KETONESUR trace (5) (A) 08/25/2022 1457   KETONESUR NEGATIVE 01/31/2021 1455   PROTEINUR =100 (A) 08/25/2022 1457   PROTEINUR NEGATIVE 01/31/2021 1455   UROBILINOGEN 2.0 (A) 08/25/2022 1457   UROBILINOGEN 0.2 01/19/2012 2202   NITRITE Negative 08/25/2022 1457   NITRITE NEGATIVE 01/31/2021 1455   LEUKOCYTESUR Negative 08/25/2022 1457   LEUKOCYTESUR NEGATIVE 01/31/2021 1455    No results found for: "LABMICR", "WBCUA", "RBCUA", "LABEPIT", "MUCUS", "BACTERIA"  Pertinent Imaging:  No results found for this or any previous visit.  No results found for this or any previous visit.  No results found for this or any previous visit.  No results found for this or any previous visit.  No results found for this or any previous visit.  No valid procedures specified. No results found for this or any previous visit.  Results for orders placed during the hospital encounter of 01/31/21  CT Renal Stone Study  Narrative CLINICAL DATA:  RIGHT-side back and flank pain since yesterday question kidney stone  EXAM: CT ABDOMEN AND PELVIS WITHOUT CONTRAST  TECHNIQUE: Multidetector CT imaging of the abdomen and pelvis was performed following the standard protocol without IV contrast. Sagittal and coronal MPR images reconstructed from axial data set. No oral contrast administered.  COMPARISON:  None  FINDINGS: Lower chest: Dependent atelectasis at lung  bases  Hepatobiliary: Dependent calculi within gallbladder. Liver unremarkable.  Pancreas: Normal appearance  Spleen: Normal appearance.  Tiny splenule is anterior to spleen.  Adrenals/Urinary Tract: Adrenal glands normal appearance. Small nonobstructing LEFT renal calculi. Tiny calculus at RIGHT renal pelvis. No renal mass, hydronephrosis,, hydroureter, or ureteral calcification.  Stomach/Bowel: Normal appendix. Stomach and bowel loops normal appearance  Vascular/Lymphatic: Atherosclerotic calcifications aorta and iliac arteries without aneurysm. No adenopathy. Scattered pelvic phleboliths.  Reproductive: Unremarkable uterus and ovaries  Other: No free air or free fluid. No hernia or inflammatory process.  Musculoskeletal: Multifactorial central choir spinal stenosis at L3-L4 and L4-L5.  IMPRESSION: Small nonobstructing LEFT renal calculi and tiny RIGHT renal pelvic calculus.  Cholelithiasis.  Multifactorial spinal stenosis of lumbar spine at L3-L4 and L4-L5.  No acute intra-abdominal or intrapelvic abnormalities.  Aortic Atherosclerosis (ICD10-I70.0).   Electronically Signed By: Lavonia Dana M.D. On: 01/31/2021  11:54   Assessment & Plan:    1. Microscopic hematuria -renal US - Urinalysis, Routine w reflex microscopic  2. Nephrolithiasis -renal US  No follow-ups on file.  Nicolette Bang, MD  [facility] [place]

## 2022-11-20 ENCOUNTER — Encounter: Payer: Self-pay | Admitting: Urology

## 2022-11-20 LAB — URINALYSIS, ROUTINE W REFLEX MICROSCOPIC
Bilirubin, UA: NEGATIVE
Glucose, UA: NEGATIVE
Ketones, UA: NEGATIVE
Leukocytes,UA: NEGATIVE
Nitrite, UA: NEGATIVE
RBC, UA: NEGATIVE
Specific Gravity, UA: 1.02 (ref 1.005–1.030)
Urobilinogen, Ur: 2 mg/dL — ABNORMAL HIGH (ref 0.2–1.0)
pH, UA: 6.5 (ref 5.0–7.5)

## 2022-11-20 LAB — MICROSCOPIC EXAMINATION

## 2022-11-20 LAB — CYTOLOGY, URINE

## 2022-12-19 ENCOUNTER — Ambulatory Visit (HOSPITAL_COMMUNITY)
Admission: RE | Admit: 2022-12-19 | Discharge: 2022-12-19 | Disposition: A | Discharge status: Home / Self Care | Payer: PPO | Source: Ambulatory Visit | Attending: Urology | Admitting: Urology

## 2022-12-19 DIAGNOSIS — R310 Gross hematuria: Secondary | ICD-10-CM | POA: Diagnosis not present

## 2022-12-19 DIAGNOSIS — N200 Calculus of kidney: Secondary | ICD-10-CM | POA: Diagnosis not present

## 2023-01-07 ENCOUNTER — Ambulatory Visit: Payer: PPO | Admitting: Urology

## 2023-02-20 ENCOUNTER — Ambulatory Visit: Payer: PPO | Admitting: Urology

## 2023-02-20 DIAGNOSIS — R310 Gross hematuria: Secondary | ICD-10-CM

## 2023-03-11 NOTE — Progress Notes (Deleted)
History of Present Illness: Brittany Bass is a 72 y.o. female who presents today for follow up visit at [facility]. - GU history: 1. Kidney stones.  2. Microscopic hematuria.  At last visit with Dr. Ronne Binning on 11/19/2022: Doing well.  Negative urine microscopy (no blood). Negative voided cytology.  The plan was renal US.   Since last visit: 12/19/2022: RUS showed a 5 mm nonobstructive left kidney stone. No other abnormalities.  Today: She reports ***  She {Actions; denies-reports:120008} increased urinary urgency, frequency, dysuria, gross hematuria, straining to void, or sensations of incomplete emptying.  She {Actions; denies-reports:120008} flank pain. She {Actions; denies-reports:120008} abdominal pain.   Fall Screening: Do you usually have a device to assist in your mobility? {yes/no:20286} ***cane / ***walker / ***wheelchair   Medications: Current Outpatient Medications  Medication Sig Dispense Refill   clonazePAM (KLONOPIN) 0.5 MG tablet Take 0.5 mg by mouth at bedtime.     escitalopram (LEXAPRO) 5 MG tablet Take 5 mg by mouth daily.     furosemide (LASIX) 20 MG tablet Take 1 tablet (20 mg total) by mouth daily. 30 tablet 11   Ipratropium-Albuterol (COMBIVENT RESPIMAT) 20-100 MCG/ACT AERS respimat Inhale 1 puff into the lungs every 6 (six) hours as needed for wheezing or shortness of breath. 4 g 3   oxyCODONE-acetaminophen (PERCOCET) 5-325 MG tablet Take 1 tablet by mouth every 6 (six) hours as needed. 20 tablet 0   TRELEGY ELLIPTA 100-62.5-25 MCG/ACT AEPB Take 1 puff by mouth daily.     VITAMIN D PO Take 1 tablet by mouth daily.     No current facility-administered medications for this visit.    Allergies: Allergies  Allergen Reactions   Shellfish Allergy Anaphylaxis   Hydrocodone     Nausea and "cold sweat"   Penicillins Hives and Nausea And Vomiting    .Did it involve swelling of the face/tongue/throat, SOB, or low BP? Yes Did it  involve sudden or severe rash/hives, skin peeling, or any reaction on the inside of your mouth or nose? Yes Did you need to seek medical attention at a hospital or doctor's office? Yes When did it last happen?       If all above answers are "NO", may proceed with cephalosporin use.     Past Medical History:  Diagnosis Date   COPD (chronic obstructive pulmonary disease) (HCC)    Kidney stone    Osteoporosis    Past Surgical History:  Procedure Laterality Date   CESAREAN SECTION     jaundice     Family History  Problem Relation Age of Onset   Breast cancer Sister    Social History   Socioeconomic History   Marital status: Single    Spouse name: Not on file   Number of children: Not on file   Years of education: Not on file   Highest education level: Not on file  Occupational History   Not on file  Tobacco Use   Smoking status: Every Day    Packs/day: 1    Types: Cigarettes   Smokeless tobacco: Never  Vaping Use   Vaping Use: Former  Substance and Sexual Activity   Alcohol use: No   Drug use: No   Sexual activity: Not on file  Other Topics Concern   Not on file  Social History Narrative   Not on file   Social Determinants of Health   Financial Resource Strain: Not on file  Food Insecurity: Not on file  Transportation  Needs: Not on file  Physical Activity: Not on file  Stress: Not on file  Social Connections: Not on file  Intimate Partner Violence: Not on file    Review of Systems Constitutional: Patient ***denies any unintentional weight loss or change in strength lntegumentary: Patient ***denies any rashes or pruritus Eyes: Patient denies ***dry eyes ENT: Patient ***denies dry mouth Cardiovascular: Patient ***denies chest pain or syncope Respiratory: Patient ***denies shortness of breath Gastrointestinal: Patient ***denies nausea, vomiting, constipation, or diarrhea Musculoskeletal: Patient ***denies muscle cramps or weakness Neurologic: Patient  ***denies convulsions or seizures Psychiatric: Patient ***denies memory problems Allergic/Immunologic: Patient ***denies recent allergic reaction(s) Hematologic/Lymphatic: Patient denies bleeding tendencies Endocrine: Patient ***denies heat/cold intolerance  GU: As per HPI.  OBJECTIVE There were no vitals filed for this visit. There is no height or weight on file to calculate BMI.  Physical Examination  Constitutional: ***No obvious distress; patient is ***non-toxic appearing  Cardiovascular: ***No visible lower extremity edema.  Respiratory: The patient does ***not have audible wheezing/stridor; respirations do ***not appear labored  Gastrointestinal: Abdomen ***non-distended Musculoskeletal: ***Normal ROM of UEs  Skin: ***No obvious rashes/open sores  Neurologic: CN 2-12 grossly ***intact Psychiatric: Answered questions ***appropriately with ***normal affect  Hematologic/Lymphatic/Immunologic: ***No obvious bruises or sites of spontaneous bleeding  UA: {Desc; negative/positive:13464} *** WBC/hpf, *** RBC/hpf, bacteria (***) *** nitrites, *** leukocytes, *** blood PVR: *** ml  ASSESSMENT No diagnosis found. ***  Will plan for follow up in ***months / ***1 year or sooner if needed. Pt verbalized understanding and agreement. All questions were answered.  PLAN Advised the following: 1. *** 2. ***No follow-ups on file.  No orders of the defined types were placed in this encounter.   It has been explained that the patient is to follow regularly with their PCP in addition to all other providers involved in their care and to follow instructions provided by these respective offices. Patient advised to contact urology clinic if any urologic-pertaining questions, concerns, new symptoms or problems arise in the interim period.  There are no Patient Instructions on file for this visit.  Electronically signed by:  Donnita Falls, FNP   03/11/23    9:31 PM

## 2023-03-13 ENCOUNTER — Ambulatory Visit: Payer: PPO | Admitting: Urology

## 2023-03-13 DIAGNOSIS — N200 Calculus of kidney: Secondary | ICD-10-CM

## 2023-03-13 DIAGNOSIS — R3129 Other microscopic hematuria: Secondary | ICD-10-CM

## 2023-04-21 DIAGNOSIS — E782 Mixed hyperlipidemia: Secondary | ICD-10-CM | POA: Diagnosis not present

## 2023-04-27 DIAGNOSIS — J449 Chronic obstructive pulmonary disease, unspecified: Secondary | ICD-10-CM | POA: Diagnosis not present

## 2023-04-27 DIAGNOSIS — E782 Mixed hyperlipidemia: Secondary | ICD-10-CM | POA: Diagnosis not present

## 2023-04-27 DIAGNOSIS — G8929 Other chronic pain: Secondary | ICD-10-CM | POA: Diagnosis not present

## 2023-04-27 DIAGNOSIS — N1831 Chronic kidney disease, stage 3a: Secondary | ICD-10-CM | POA: Diagnosis not present

## 2023-04-27 DIAGNOSIS — Z6379 Other stressful life events affecting family and household: Secondary | ICD-10-CM | POA: Diagnosis not present

## 2023-04-27 DIAGNOSIS — M545 Low back pain, unspecified: Secondary | ICD-10-CM | POA: Diagnosis not present

## 2023-04-27 DIAGNOSIS — R202 Paresthesia of skin: Secondary | ICD-10-CM | POA: Diagnosis not present

## 2023-04-27 DIAGNOSIS — F411 Generalized anxiety disorder: Secondary | ICD-10-CM | POA: Diagnosis not present

## 2023-04-27 DIAGNOSIS — D75839 Thrombocytosis, unspecified: Secondary | ICD-10-CM | POA: Diagnosis not present

## 2023-04-27 DIAGNOSIS — R7303 Prediabetes: Secondary | ICD-10-CM | POA: Diagnosis not present

## 2023-04-27 DIAGNOSIS — R609 Edema, unspecified: Secondary | ICD-10-CM | POA: Diagnosis not present

## 2023-12-07 ENCOUNTER — Ambulatory Visit: Admission: EM | Admit: 2023-12-07 | Discharge: 2023-12-07 | Disposition: A | Discharge status: Home / Self Care

## 2023-12-07 DIAGNOSIS — H65192 Other acute nonsuppurative otitis media, left ear: Secondary | ICD-10-CM

## 2023-12-07 DIAGNOSIS — R42 Dizziness and giddiness: Secondary | ICD-10-CM | POA: Diagnosis not present

## 2023-12-07 MED ORDER — MECLIZINE HCL 25 MG PO TABS: 25.0000 mg | ORAL_TABLET | Freq: Three times a day (TID) | ORAL | 0 refills | Status: AC | PRN

## 2023-12-07 MED ORDER — AZELASTINE HCL 0.1 % NA SOLN: 1.0000 | Freq: Two times a day (BID) | NASAL | 0 refills | Status: AC

## 2023-12-07 NOTE — ED Triage Notes (Signed)
 left ear itching, dizziness pt states the world is spinning, nausea x 2 days. Pt states she has a hx of vertigo.

## 2023-12-07 NOTE — ED Provider Notes (Signed)
 [facility]    CSN: [id number] Arrival date & time: 12/07/23  0909      History   Chief Complaint Chief Complaint  Patient presents with   Dizziness    HPI Brittany Bass is a 73 y.o. female.   Patient presenting today with 2-day history of left ear pressure and itching, and now room spinning dizziness nausea that started last night.  Denies associated head injury, visual change, headache, mental status change, vomiting, chest pain, shortness of breath, extremity weakness numbness or tingling.  She states she has a history of vertigo and this feels exactly the same.  So far not tried anything over-the-counter for symptoms apart from rest.    Past Medical History:  Diagnosis Date   COPD (chronic obstructive pulmonary disease) (HCC)    Kidney stone    Osteoporosis     Patient Active Problem List   Diagnosis Date Noted   COPD with acute exacerbation (HCC) 03/12/2019   Acute CHF (congestive heart failure) (HCC) 03/12/2019   Obesity 03/12/2019   Essential hypertension 03/12/2019    Past Surgical History:  Procedure Laterality Date   CESAREAN SECTION     jaundice      OB History   No obstetric history on file.      Home Medications    Prior to Admission medications   Medication Sig Start Date End Date Taking? Authorizing Provider  azelastine (ASTELIN) 0.1 % nasal spray Place 1 spray into both nostrils 2 (two) times daily. Use in each nostril as directed 12/07/23  Yes Particia Nearing, PA-C  clonazePAM (KLONOPIN) 0.5 MG tablet Take 0.5 mg by mouth at bedtime. 10/27/22  Yes [provider]  escitalopram (LEXAPRO) 5 MG tablet Take 5 mg by mouth daily. 11/17/22  Yes [provider]  furosemide (LASIX) 20 MG tablet Take 1 tablet (20 mg total) by mouth daily. 03/15/19 12/07/23 Yes Shah, Pratik D, DO  meclizine (ANTIVERT) 25 MG tablet Take 1 tablet (25 mg total) by mouth 3 (three) times daily as needed for dizziness. 12/07/23  Yes Particia Nearing, PA-C  TRELEGY ELLIPTA 100-62.5-25 MCG/ACT AEPB Take 1 puff by mouth daily. 07/12/22  Yes [provider]  VITAMIN D PO Take 1 tablet by mouth daily.   Yes [provider]  Ipratropium-Albuterol (COMBIVENT RESPIMAT) 20-100 MCG/ACT AERS respimat Inhale 1 puff into the lungs every 6 (six) hours as needed for wheezing or shortness of breath. 03/15/19   Sherryll Burger, Pratik D, DO  oxyCODONE-acetaminophen (PERCOCET) 5-325 MG tablet Take 1 tablet by mouth every 6 (six) hours as needed. Patient not taking: Reported on 12/07/2023 01/31/21   Bethann Berkshire, MD  traMADol Janean Sark) 50 MG tablet SMARTSIG:1 Tablet(s) By Mouth Every 12 Hours 10/08/23   [provider]    Family History Family History  Problem Relation Age of Onset   Breast cancer Sister     Social History Social History   Tobacco Use   Smoking status: Every Day    Current packs/day: 1.00    Types: Cigarettes   Smokeless tobacco: Never  Vaping Use   Vaping status: Former  Substance Use Topics   Alcohol use: No   Drug use: No     Allergies   Shellfish allergy, Hydrocodone, and Penicillins   Review of Systems Review of Systems PER HPI  Physical Exam Triage Vital Signs ED Triage Vitals  Encounter Vitals Group     BP 12/07/23 1117 133/71     Systolic BP Percentile --  Diastolic BP Percentile --      Pulse Rate 12/07/23 1117 69     Resp 12/07/23 1117 16     Temp 12/07/23 1117 97.6 F (36.4 C)     Temp Source 12/07/23 1117 Oral     SpO2 12/07/23 1117 94 %     Weight --      Height --      Head Circumference --      Peak Flow --      Pain Score 12/07/23 1119 0     Pain Loc --      Pain Education --      Exclude from Growth Chart --    No data found.  Updated Vital Signs BP 133/71 (BP Location: Right Arm)   Pulse 69   Temp 97.6 F (36.4 C) (Oral)   Resp 16   SpO2 94%   Visual Acuity Right Eye Distance:   Left Eye Distance:   Bilateral Distance:    Right Eye Near:    Left Eye Near:    Bilateral Near:     Physical Exam Vitals and nursing note reviewed.  Constitutional:      Appearance: Normal appearance. She is not ill-appearing.  HENT:     Head: Atraumatic.     Right Ear: Tympanic membrane normal.     Ears:     Comments: Left middle ear effusion present    Mouth/Throat:     Mouth: Mucous membranes are moist.     Pharynx: Oropharynx is clear.  Eyes:     Extraocular Movements: Extraocular movements intact.     Conjunctiva/sclera: Conjunctivae normal.  Cardiovascular:     Rate and Rhythm: Normal rate and regular rhythm.     Heart sounds: Normal heart sounds.  Pulmonary:     Effort: Pulmonary effort is normal.     Breath sounds: Normal breath sounds.  Musculoskeletal:        General: Normal range of motion.     Cervical back: Normal range of motion and neck supple.  Skin:    General: Skin is warm and dry.  Neurological:     General: No focal deficit present.     Mental Status: She is alert and oriented to person, place, and time.     Cranial Nerves: No cranial nerve deficit.     Motor: No weakness.     Gait: Gait normal.  Psychiatric:        Mood and Affect: Mood normal.        Thought Content: Thought content normal.        Judgment: Judgment normal.      UC Treatments / Results  Labs (all labs ordered are listed, but only abnormal results are displayed) Labs Reviewed - No data to display  EKG   Radiology No results found.  Procedures Procedures (including critical care time)  Medications Ordered in UC Medications - No data to display  Initial Impression / Assessment and Plan / UC Course  I have reviewed the triage vital signs and the nursing notes.  Pertinent labs & imaging results that were available during my care of the patient were reviewed by me and considered in my medical decision making (see chart for details).     Vitals and exam very reassuring today with no focal neurologic deficits.  Symptoms  consistent with vertigo and has a history of the same.  Further workup into dizziness deferred with shared decision making today.  Treat middle ear effusion with  Astelin nasal spray, meclizine as needed for dizziness in addition to Epley maneuvers and slow intentional movements of the head.  Close PCP follow-up recommended, ED for worsening symptoms at any time.   Final Clinical Impressions(s) / UC Diagnoses   Final diagnoses:  Acute MEE (middle ear effusion), left  Vertigo   Discharge Instructions   None    ED Prescriptions     Medication Sig Dispense Auth. Provider   meclizine (ANTIVERT) 25 MG tablet Take 1 tablet (25 mg total) by mouth 3 (three) times daily as needed for dizziness. 30 tablet Particia Nearing, [place]   azelastine (ASTELIN) 0.1 % nasal spray Place 1 spray into both nostrils 2 (two) times daily. Use in each nostril as directed 30 mL Particia Nearing, PA-C      PDMP not reviewed this encounter.   Particia Nearing, [place] 12/07/23 1224

## 2023-12-16 DIAGNOSIS — E119 Type 2 diabetes mellitus without complications: Secondary | ICD-10-CM | POA: Diagnosis not present

## 2023-12-16 DIAGNOSIS — E782 Mixed hyperlipidemia: Secondary | ICD-10-CM | POA: Diagnosis not present

## 2024-01-20 ENCOUNTER — Other Ambulatory Visit (HOSPITAL_COMMUNITY): Payer: Self-pay | Admitting: Nurse Practitioner

## 2024-01-20 DIAGNOSIS — R7303 Prediabetes: Secondary | ICD-10-CM | POA: Diagnosis not present

## 2024-01-20 DIAGNOSIS — E782 Mixed hyperlipidemia: Secondary | ICD-10-CM | POA: Diagnosis not present

## 2024-01-20 DIAGNOSIS — Z1231 Encounter for screening mammogram for malignant neoplasm of breast: Secondary | ICD-10-CM

## 2024-01-20 DIAGNOSIS — M545 Low back pain, unspecified: Secondary | ICD-10-CM | POA: Diagnosis not present

## 2024-01-20 DIAGNOSIS — R202 Paresthesia of skin: Secondary | ICD-10-CM | POA: Diagnosis not present

## 2024-01-20 DIAGNOSIS — N1831 Chronic kidney disease, stage 3a: Secondary | ICD-10-CM | POA: Diagnosis not present

## 2024-01-20 DIAGNOSIS — F17200 Nicotine dependence, unspecified, uncomplicated: Secondary | ICD-10-CM | POA: Diagnosis not present

## 2024-01-20 DIAGNOSIS — J449 Chronic obstructive pulmonary disease, unspecified: Secondary | ICD-10-CM | POA: Diagnosis not present

## 2024-01-20 DIAGNOSIS — F411 Generalized anxiety disorder: Secondary | ICD-10-CM | POA: Diagnosis not present

## 2024-01-20 DIAGNOSIS — D75839 Thrombocytosis, unspecified: Secondary | ICD-10-CM | POA: Diagnosis not present

## 2024-01-20 DIAGNOSIS — G8929 Other chronic pain: Secondary | ICD-10-CM | POA: Diagnosis not present

## 2024-01-20 DIAGNOSIS — Z13820 Encounter for screening for osteoporosis: Secondary | ICD-10-CM

## 2024-01-20 DIAGNOSIS — Z0001 Encounter for general adult medical examination with abnormal findings: Secondary | ICD-10-CM | POA: Diagnosis not present

## 2024-02-01 ENCOUNTER — Other Ambulatory Visit (HOSPITAL_COMMUNITY)

## 2024-02-01 ENCOUNTER — Ambulatory Visit (HOSPITAL_COMMUNITY)

## 2024-04-20 DIAGNOSIS — Z6379 Other stressful life events affecting family and household: Secondary | ICD-10-CM | POA: Diagnosis not present

## 2024-04-20 DIAGNOSIS — Z2820 Immunization not carried out because of patient decision for unspecified reason: Secondary | ICD-10-CM | POA: Diagnosis not present

## 2024-04-20 DIAGNOSIS — F411 Generalized anxiety disorder: Secondary | ICD-10-CM | POA: Diagnosis not present

## 2024-04-20 DIAGNOSIS — G8929 Other chronic pain: Secondary | ICD-10-CM | POA: Diagnosis not present

## 2024-04-20 DIAGNOSIS — F5105 Insomnia due to other mental disorder: Secondary | ICD-10-CM | POA: Diagnosis not present

## 2024-04-20 DIAGNOSIS — R609 Edema, unspecified: Secondary | ICD-10-CM | POA: Diagnosis not present

## 2024-04-20 DIAGNOSIS — M545 Low back pain, unspecified: Secondary | ICD-10-CM | POA: Diagnosis not present

## 2024-04-20 DIAGNOSIS — R202 Paresthesia of skin: Secondary | ICD-10-CM | POA: Diagnosis not present

## 2024-04-20 DIAGNOSIS — J449 Chronic obstructive pulmonary disease, unspecified: Secondary | ICD-10-CM | POA: Diagnosis not present

## 2024-07-15 DIAGNOSIS — E782 Mixed hyperlipidemia: Secondary | ICD-10-CM | POA: Diagnosis not present

## 2024-07-21 DIAGNOSIS — M545 Low back pain, unspecified: Secondary | ICD-10-CM | POA: Diagnosis not present

## 2024-07-21 DIAGNOSIS — G8929 Other chronic pain: Secondary | ICD-10-CM | POA: Diagnosis not present

## 2024-07-21 DIAGNOSIS — R609 Edema, unspecified: Secondary | ICD-10-CM | POA: Diagnosis not present

## 2024-07-21 DIAGNOSIS — R7303 Prediabetes: Secondary | ICD-10-CM | POA: Diagnosis not present

## 2024-07-21 DIAGNOSIS — N1831 Chronic kidney disease, stage 3a: Secondary | ICD-10-CM | POA: Diagnosis not present

## 2024-07-21 DIAGNOSIS — R202 Paresthesia of skin: Secondary | ICD-10-CM | POA: Diagnosis not present

## 2024-07-21 DIAGNOSIS — D75839 Thrombocytosis, unspecified: Secondary | ICD-10-CM | POA: Diagnosis not present

## 2024-07-21 DIAGNOSIS — E782 Mixed hyperlipidemia: Secondary | ICD-10-CM | POA: Diagnosis not present

## 2024-07-21 DIAGNOSIS — F17210 Nicotine dependence, cigarettes, uncomplicated: Secondary | ICD-10-CM | POA: Diagnosis not present

## 2024-07-21 DIAGNOSIS — F411 Generalized anxiety disorder: Secondary | ICD-10-CM | POA: Diagnosis not present

## 2024-07-21 DIAGNOSIS — J449 Chronic obstructive pulmonary disease, unspecified: Secondary | ICD-10-CM | POA: Diagnosis not present
